# Patient Record
Sex: Male | Born: 1975
Health system: Southern US, Community
[De-identification: ages and names within clinical notes are randomized; demographics above are authoritative.]

## PROBLEM LIST (undated history)

## (undated) DIAGNOSIS — K859 Acute pancreatitis without necrosis or infection, unspecified: Secondary | ICD-10-CM

## (undated) DIAGNOSIS — I10 Essential (primary) hypertension: Secondary | ICD-10-CM

## (undated) HISTORY — PX: ROTATOR CUFF REPAIR: SHX139

## (undated) HISTORY — PX: GANGLION CYST EXCISION: SHX1691

---

## 2001-03-16 ENCOUNTER — Encounter: Payer: Self-pay | Admitting: Emergency Medicine

## 2001-03-16 ENCOUNTER — Emergency Department (HOSPITAL_COMMUNITY): Admission: EM | Admit: 2001-03-16 | Discharge: 2001-03-16 | Payer: Self-pay | Admitting: Emergency Medicine

## 2001-12-29 ENCOUNTER — Emergency Department (HOSPITAL_COMMUNITY): Admission: EM | Admit: 2001-12-29 | Discharge: 2001-12-29 | Payer: Self-pay | Admitting: Emergency Medicine

## 2003-10-27 ENCOUNTER — Emergency Department (HOSPITAL_COMMUNITY): Admission: EM | Admit: 2003-10-27 | Discharge: 2003-10-28 | Payer: Self-pay | Admitting: Emergency Medicine

## 2003-11-03 ENCOUNTER — Encounter: Admission: RE | Admit: 2003-11-03 | Discharge: 2003-11-03 | Payer: Self-pay | Admitting: Orthopedic Surgery

## 2004-04-22 ENCOUNTER — Emergency Department (HOSPITAL_COMMUNITY): Admission: EM | Admit: 2004-04-22 | Discharge: 2004-04-22 | Payer: Self-pay | Admitting: Emergency Medicine

## 2005-05-05 IMAGING — CR DG ANKLE COMPLETE 3+V*R*
3 series · 3 of 3 positions shown · non-contrast
Comparison: none

CLINICAL DATA: Ankle injury.
RIGHT ANKLE, THREE VIEWS

[view not recorded (1 of 3)]
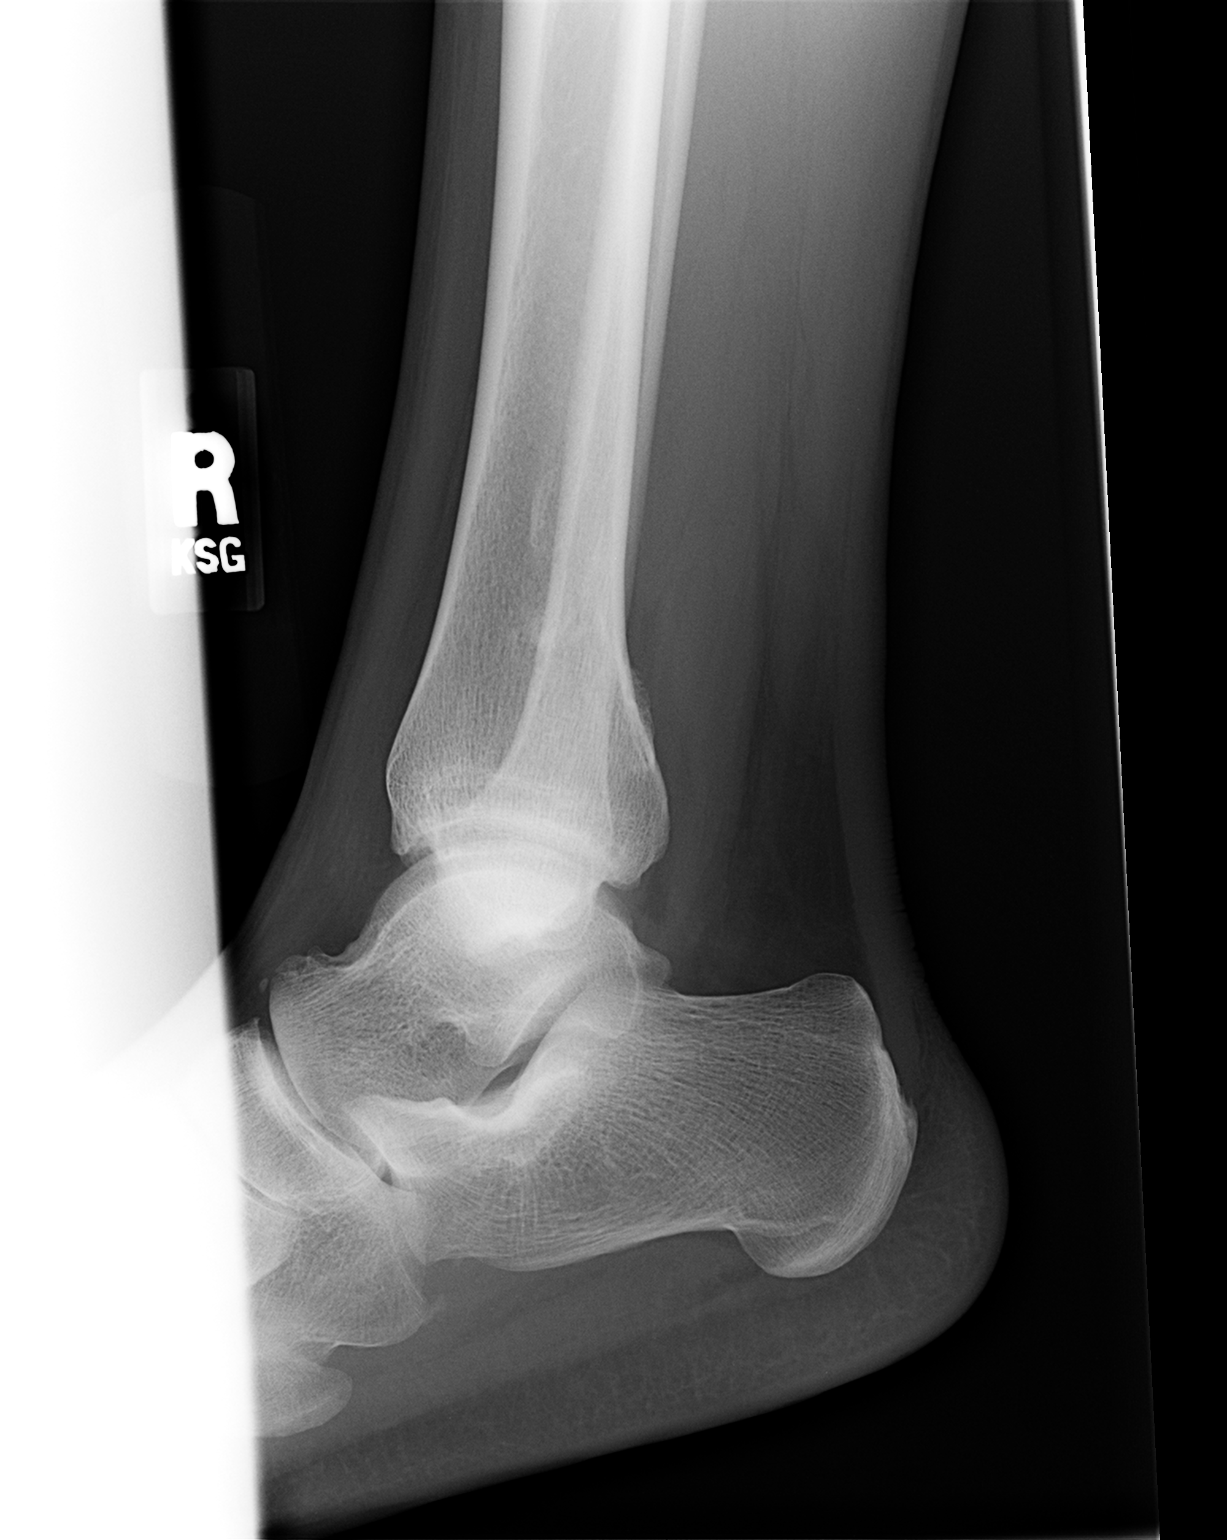

[view not recorded (2 of 3)]
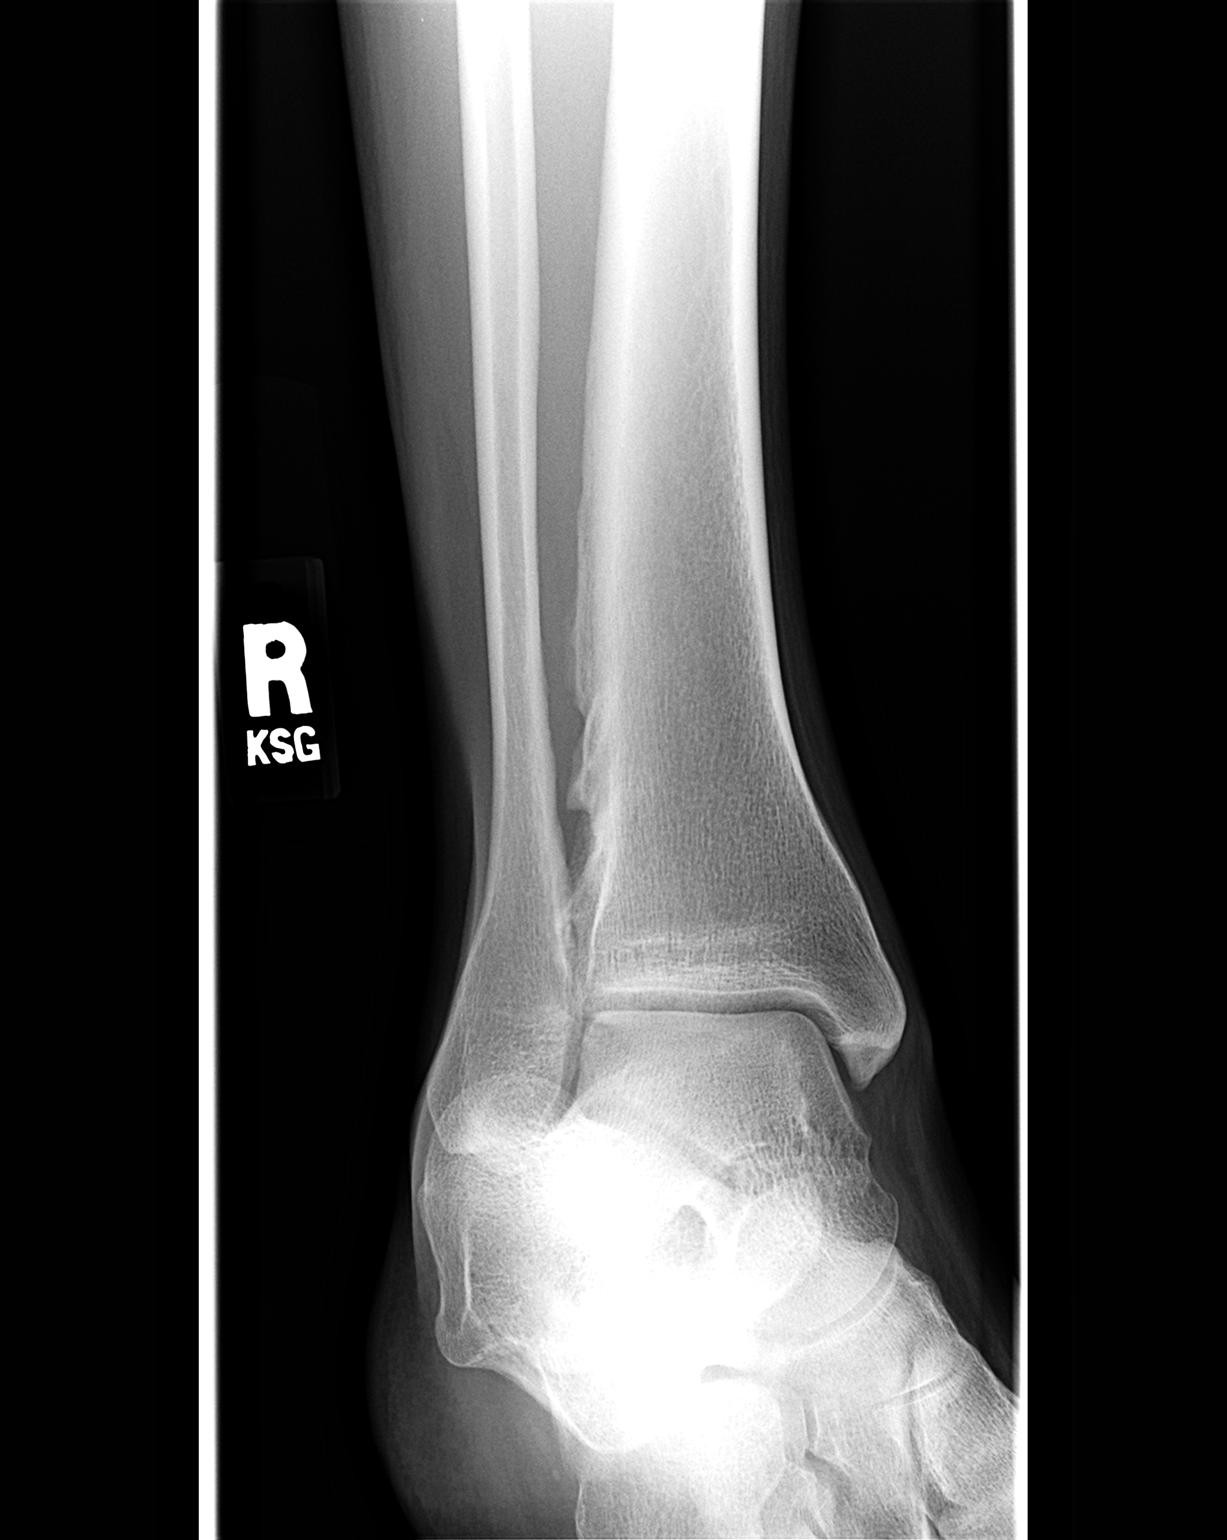

[view not recorded (3 of 3)]
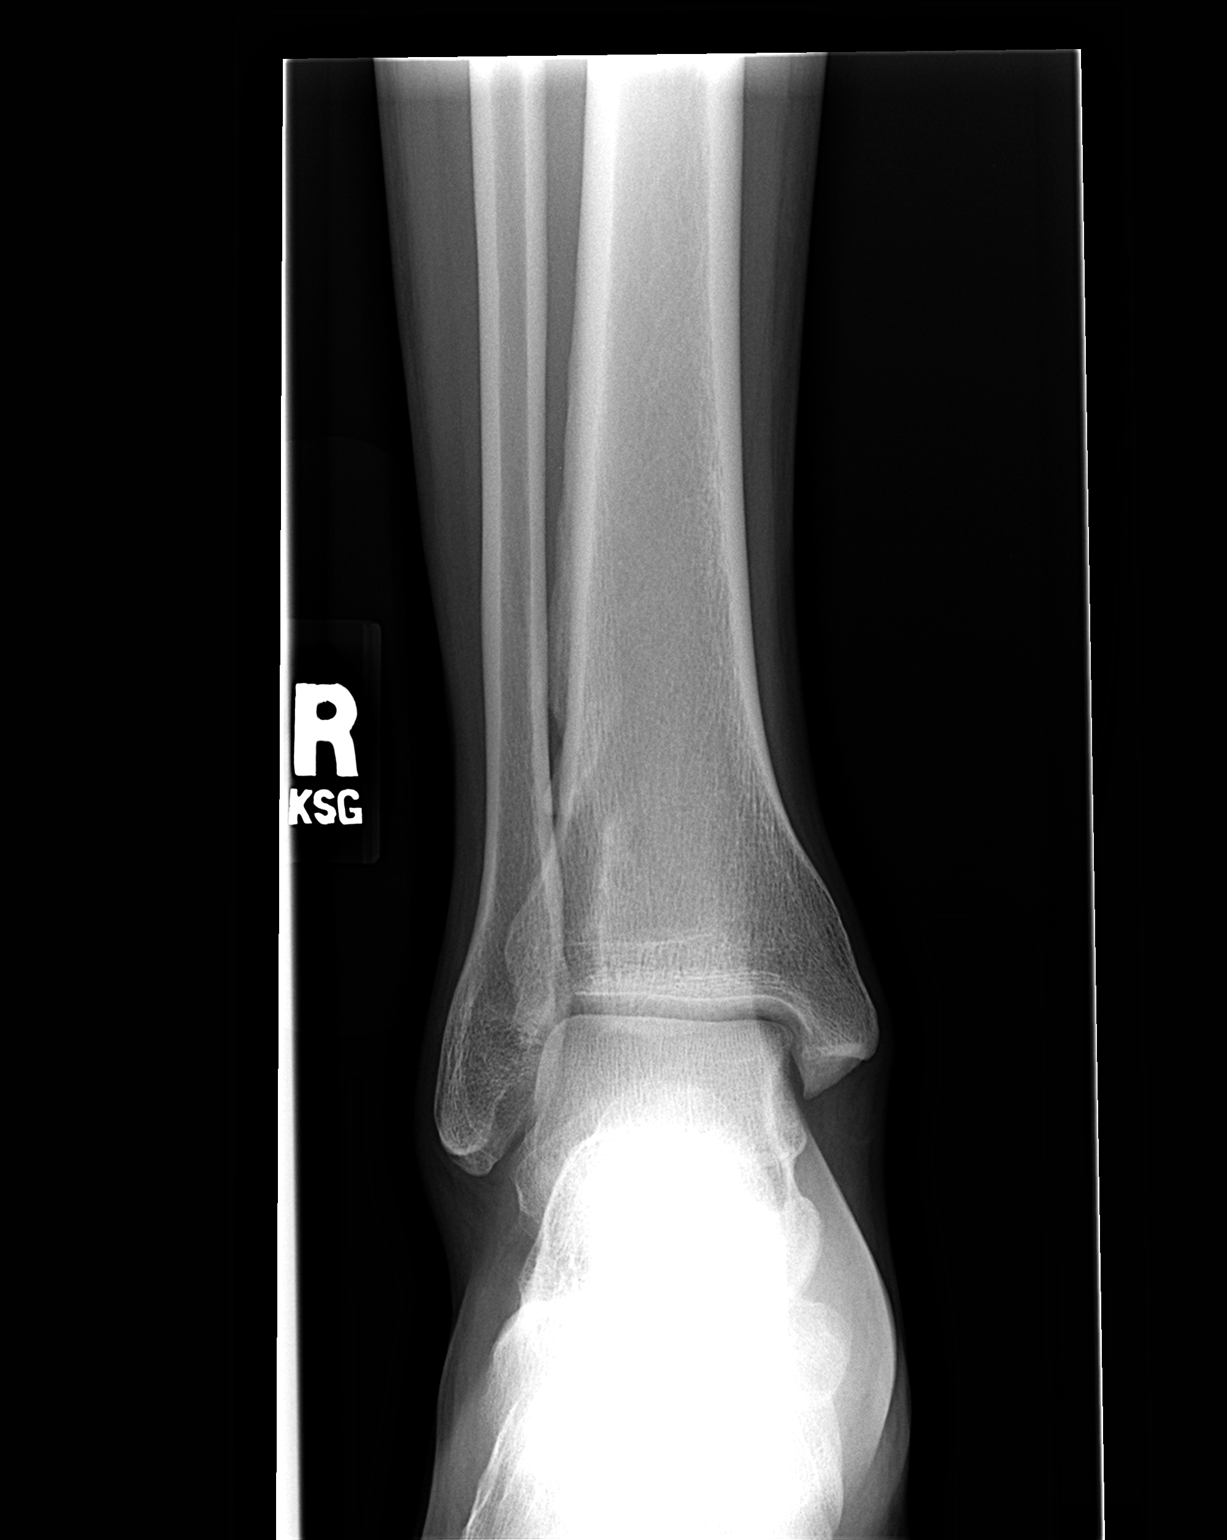

[3 of 3 positions shown; findings below may reference images not displayed]

FINDINGS: There is a small fracture fragment at the anterior and dorsal aspect of the distal talus.  This does appear to be an acute fracture.  It is approximately 2 x 8 mm.  There is a smaller round bony density anterior to the superior and dorsal talus that has corticated margins and likely represents an old avulsion fracture.  eriosteal reaction is noted at the distal tibia on the lateral aspect at the region of the metaphysis.  This does not have an ?onion skin? appearance or aggressive appearance.  However, there is an apparent lytic area in the distal tibia laterally.  It is eccentric and extends to the distal articular surface.  The margins are defined; however, they are non-sclerotic.  No other fractures or dislocations are seen. 
IMPRESSION
1.  Old avulsion from the distal talus.
2.  Acute fracture involving the distal talus.  This is a small avulsion fracture. 
3.  Lytic lesion of the distal tibia which is eccentric and extends to the epiphysis from the metaphysis.  There is a non-sclerotic margin.  Adjacent periosteal reaction is present.  In this skeletally mature young patient, considerations would include giant cell tumor, eosinophilic granuloma, infectious process.  Correlate clinically as for the need for MRI.

## 2005-07-25 ENCOUNTER — Encounter (HOSPITAL_COMMUNITY): Admission: RE | Admit: 2005-07-25 | Discharge: 2005-08-08 | Payer: Self-pay | Admitting: Orthopedic Surgery

## 2006-01-19 ENCOUNTER — Emergency Department (HOSPITAL_COMMUNITY): Admission: EM | Admit: 2006-01-19 | Discharge: 2006-01-19 | Payer: Self-pay | Admitting: Emergency Medicine

## 2007-12-21 ENCOUNTER — Ambulatory Visit (HOSPITAL_COMMUNITY): Admission: RE | Admit: 2007-12-21 | Discharge: 2007-12-21 | Payer: Self-pay | Admitting: Family Medicine

## 2008-01-04 ENCOUNTER — Encounter (HOSPITAL_COMMUNITY): Admission: RE | Admit: 2008-01-04 | Discharge: 2008-02-03 | Payer: Self-pay | Admitting: Family Medicine

## 2008-02-04 ENCOUNTER — Encounter (HOSPITAL_COMMUNITY): Admission: RE | Admit: 2008-02-04 | Discharge: 2008-03-05 | Payer: Self-pay | Admitting: Family Medicine

## 2008-04-07 ENCOUNTER — Ambulatory Visit (HOSPITAL_COMMUNITY): Admission: RE | Admit: 2008-04-07 | Discharge: 2008-04-07 | Payer: Self-pay | Admitting: Family Medicine

## 2008-05-19 ENCOUNTER — Ambulatory Visit (HOSPITAL_COMMUNITY): Payer: Self-pay | Admitting: Psychology

## 2009-01-08 ENCOUNTER — Emergency Department (HOSPITAL_COMMUNITY): Admission: EM | Admit: 2009-01-08 | Discharge: 2009-01-08 | Payer: Self-pay | Admitting: Emergency Medicine

## 2010-05-30 ENCOUNTER — Ambulatory Visit (HOSPITAL_COMMUNITY): Admission: RE | Admit: 2010-05-30 | Discharge: 2010-05-30 | Payer: Self-pay | Admitting: Family Medicine

## 2011-01-19 ENCOUNTER — Emergency Department (HOSPITAL_COMMUNITY)
Admission: EM | Admit: 2011-01-19 | Discharge: 2011-01-19 | Disposition: A | Discharge status: Home / Self Care | Payer: BC Managed Care – PPO | Attending: Emergency Medicine | Admitting: Emergency Medicine

## 2011-01-19 DIAGNOSIS — F17200 Nicotine dependence, unspecified, uncomplicated: Secondary | ICD-10-CM | POA: Insufficient documentation

## 2011-01-19 DIAGNOSIS — K089 Disorder of teeth and supporting structures, unspecified: Secondary | ICD-10-CM | POA: Insufficient documentation

## 2012-01-21 ENCOUNTER — Encounter (HOSPITAL_COMMUNITY): Payer: Self-pay | Admitting: Emergency Medicine

## 2012-01-21 ENCOUNTER — Emergency Department (HOSPITAL_COMMUNITY)
Admission: EM | Admit: 2012-01-21 | Discharge: 2012-01-21 | Disposition: A | Discharge status: Home / Self Care | Payer: Self-pay | Attending: Emergency Medicine | Admitting: Emergency Medicine

## 2012-01-21 DIAGNOSIS — Z139 Encounter for screening, unspecified: Secondary | ICD-10-CM

## 2012-01-21 DIAGNOSIS — R5383 Other fatigue: Secondary | ICD-10-CM | POA: Insufficient documentation

## 2012-01-21 DIAGNOSIS — F17200 Nicotine dependence, unspecified, uncomplicated: Secondary | ICD-10-CM | POA: Insufficient documentation

## 2012-01-21 DIAGNOSIS — R350 Frequency of micturition: Secondary | ICD-10-CM | POA: Insufficient documentation

## 2012-01-21 DIAGNOSIS — R5381 Other malaise: Secondary | ICD-10-CM | POA: Insufficient documentation

## 2012-01-21 LAB — URINALYSIS, ROUTINE W REFLEX MICROSCOPIC
Bilirubin Urine: NEGATIVE
Glucose, UA: NEGATIVE mg/dL
Hgb urine dipstick: NEGATIVE
Ketones, ur: NEGATIVE mg/dL
Nitrite: NEGATIVE

## 2012-01-21 LAB — GLUCOSE, CAPILLARY: Glucose-Capillary: 87 mg/dL (ref 70–99)

## 2012-01-21 LAB — POCT I-STAT, CHEM 8
BUN: 19 mg/dL (ref 6–23)
HCT: 45 % (ref 39.0–52.0)
Hemoglobin: 15.3 g/dL (ref 13.0–17.0)
Sodium: 141 mEq/L (ref 135–145)
TCO2: 27 mmol/L (ref 0–100)

## 2012-01-21 NOTE — Discharge Instructions (Signed)
RESOURCE GUIDE  Chronic Pain Problems: Contact [facility]  [phone] Patients need to be referred by their primary care doctor.  Insufficient Money for Medicine: Contact United Way:  call "211" or [facility] [phone].  No Primary Care Doctor: - Call [facility]  [phone] - can help you locate a primary care doctor that  accepts your insurance, provides certain services, etc. - Physician Referral Service- 1-800-533-3463  Agencies that provide inexpensive medical care: - [facility]  [phone] - [facility]  [phone] - [facility]  [phone] - [facility]  [phone] - Planned Parenthood  [phone] - [facility]  [phone]  Medicaid-accepting [place] Providers: - [facility]- [address]  [phone], Mon-Fri 9am-7pm, Sat 9am-1pm - [facility]- [address], [address]  [phone] - [facility]- [address], [address]  [phone] - [facility]- [address] [place] [address]  [phone] - Veita Bland- [address], [address], [phone]  Only accepts [place] Access Medicaid patients after they have their name  applied to their card  Self Pay (no insurance) in [place]: - Sickle Cell Patients: Dr Eric Dean, [facility]  [address], [phone] - [facility]- [address]  [phone]       -     [facility] [place]- [address]       -     [facility]- see information above (Speak to Pam H if you do not have insurance)       -  [facility]- [address], [phone]       -  [facility] [place]- [address],  [phone]       -  [facility]- [address] [place] [address], [phone]       -  Dr Osei-Bonsu-  [address], [place], [phone]       -  [facility]- [address], [phone]       -  [facility]- [address] [place] [address], [phone], also [address], [phone]       -    [facility]- [address], [phone], 1st & 3rd Saturday   every month, 10am-1pm  1) Find a Doctor and Pay Out of Pocket Although you won't have to find out who is covered by your insurance plan, it is a good idea to ask around and get recommendations. You will then need to call the office and see if the doctor you have chosen will accept you as a new patient and what types of options they offer for patients who are self-pay. Some doctors offer discounts or will set up payment plans for their patients who do not have insurance, but you will need to ask so you aren't surprised when you get to your appointment.  2) Contact Your Local Health Department Not all health departments have doctors that can see patients for sick visits, but many do, so it is worth a call to see if yours does. If you don't know where your local health department is, you can check in your phone book. The CDC also has a tool to help you locate your state's health department, and many state websites also have   listings of all of their local health departments.  3) Find a Walk-in Clinic If your illness is not likely to be very severe or complicated, you may want to try a walk in clinic. These are popping up all over the country in pharmacies, drugstores, and shopping centers. They're usually staffed by nurse practitioners or physician assistants that have been trained to treat common illnesses and complaints. They're usually fairly quick and inexpensive. However, if you have serious medical issues or chronic medical problems, these are probably not your best option  STD Testing - [place] [facility] [place], [facility], [address], [place], phone [phone] or [phone].  Monday - Friday, call for an appointment. - [place]  [facility] [place], [facility] Clinic, [address], [place], phone [phone] or [phone].  Monday - Friday, call for an appointment.  Abuse/Neglect: - [place] Child Abuse Hotline (336) 641-3795 - [place] Child Abuse Hotline 800-378-5315 (After Hours)  Emergency Shelter:  [facility] (336) 271-5985  Maternity Homes: - [facility] (336) 275-9566 - [facility] (704) 372-4663  MRSA Hotline #:   832-7006  [place] Resources  [facility] [place]  [employer] [place] [facility] [address]                 [address]         [address]  [place]                                               [place]                              [place] Phone:  [phone]                                  Phone:  [phone]                   Phone:  [phone]  [place] [facility], [phone] - [place] Services - [facility]- 1-888-581-9988       -     [facility] in [place], [address],                                  336-349-4454, Insurance  [place] Child Abuse Hotline (336) 342-1394 or (336) 342-3537 (After Hours)   Behavioral Health Services  Substance Abuse Resources: - [facility]  336-882-2125 - [facility] 336-784-9470 - [facility] 336-285-9073 - [facility] 336-845-3988 - Residential & Outpatient Substance Abuse Program  800-659-3381  Psychological Services: -  Health  [phone] - [facility]  [phone] - [place], [address], [place], ACCESS LINE: 1-800-853-5163 or 336-641-4981, Http://www.[facility].com/services/adult.htm  Dental Assistance  If unable to pay or uninsured, contact:  [facility] or [place] [facility] to become qualified for the adult dental  clinic.  Patients with Medicaid:  [facility] [address], [phone], [phone]  If unable   to pay, or uninsured, contact [facility] (712) 517-8109) or [facility] [phone] in [place], [phone] in [facility]) to become qualified for the adult dental clinic  Other Low-Cost Community Dental Services: - [facility]- [address] [place], [place], [place], [address], [phone], [phone], 2nd and 4th Thursday of the month at 6:30am.  10 clients each day by appointment, can sometimes see walk-in patients if someone does not show for an appointment. [facility]- [address] [place], [place], [address], [phone] - [facility] [address] [place], [place], [address], [phone] [facility]- [phone] [facility] Department- [phone] [facility] [phone]     Call your regular medical doctor today to schedule a follow up appointment within the next week.  Return to the Emergency Department immediately sooner if worsening.

## 2012-01-21 NOTE — ED Notes (Signed)
Pt states diabetes runs in family. Here today for check of sugar levels. Wants blood work to see if he is diabetic. Pt states weakness and urinary frequency for several months.

## 2012-01-21 NOTE — ED Provider Notes (Signed)
History   This chart was scribed for Laray Anger, DO by Sofie Rower. The patient was seen in room APA15/APA15 and the patient's care was started at 9:55 AM     CSN: [id number]  Arrival date & time 01/21/12  1308   First MD Initiated Contact with Patient 01/21/12 0945      Chief Complaint  Patient presents with  . Fatigue  . Urinary Frequency     HPI  James Gordon is a 36 y.o. male who presents to the Emergency Department complaining of gradual onset and persistence of constant generalized fatigue that began today.  Has been associated with weakness and urinary frequency for the past several months. The pt states he "came to the ED to see if I'm a diabetic."  Denies CP/SOB, no cough, no abd pain, no back pain, no N/V/D, no fevers, no rash, no sore throat, no dysuria.     Past Surgical History  Procedure Date  . Rotator cuff repair       History  Substance Use Topics  . Smoking status: Current Everyday Smoker -- 0.5 packs/day  . Smokeless tobacco: Not on file  . Alcohol Use: Yes     occ      Review of Systems ROS: Statement: All systems negative except as marked or noted in the HPI; Constitutional: Negative for fever and chills. ; ; Eyes: Negative for eye pain, redness and discharge. ; ; ENMT: Negative for ear pain, hoarseness, nasal congestion, sinus pressure and sore throat. ; ; Cardiovascular: Negative for chest pain, palpitations, diaphoresis, dyspnea and peripheral edema. ; ; Respiratory: Negative for cough, wheezing and stridor. ; ; Gastrointestinal: Negative for nausea, vomiting, diarrhea, abdominal pain, blood in stool, hematemesis, jaundice and rectal bleeding. . ; ; Genitourinary: Negative for dysuria, flank pain and hematuria. ; ; Musculoskeletal: Negative for back pain and neck pain. Negative for swelling and trauma.; ; Skin: Negative for pruritus, rash, abrasions, blisters, bruising and skin lesion.; ; Neuro: +generalized weakness, fatigue.  Negative for  headache, lightheadedness and neck stiffness. Negative for altered level of consciousness , altered mental status, extremity weakness, paresthesias, involuntary movement, seizure and syncope.     Allergies  Review of patient's allergies indicates no known allergies.  Home Medications  No current outpatient prescriptions on file.  BP 119/88  Pulse 66  Temp(Src) 98.1 F (36.7 C) (Oral)  Resp 18  Ht 6\' 1"  (1.854 m)  Wt 195 lb (88.451 kg)  BMI 25.73 kg/m2  SpO2 100%  Physical Exam 0955: Physical examination:  Nursing notes reviewed; Vital signs and O2 SAT reviewed;  Constitutional: Well developed, Well nourished, Well hydrated, In no acute distress; Head:  Normocephalic, atraumatic; Eyes: EOMI, PERRL, No scleral icterus; ENMT: Mouth and pharynx normal, Mucous membranes moist; Neck: Supple, Full range of motion, No lymphadenopathy; Cardiovascular: Regular rate and rhythm, No murmur or gallop; Respiratory: Breath sounds clear & equal bilaterally, No rales, rhonchi, wheezes, speaking full sentences with ease, Normal respiratory effort/excursion; Chest: Nontender, Movement normal; Abdomen: Soft, Nontender, Nondistended, Normal bowel sounds; Extremities: Pulses normal, No tenderness, No edema, No calf edema or asymmetry.; Neuro: AA&Ox3, Major CN grossly intact. Speech clear, no facial droop, gait steady, No gross focal motor or sensory deficits in extremities.; Skin: Color normal, Warm, Dry.   ED Course  Procedures   MDM  MDM Reviewed: nursing note and vitals Interpretation: labs   Results for orders placed during the hospital encounter of 01/21/12  GLUCOSE, CAPILLARY      Component  Value Range   Glucose-Capillary 87  70 - 99 (mg/dL)  URINALYSIS, ROUTINE W REFLEX MICROSCOPIC      Component Value Range   Color, Urine YELLOW  YELLOW    APPearance CLEAR  CLEAR    Specific Gravity, Urine 1.025  1.005 - 1.030    pH 6.5  5.0 - 8.0    Glucose, UA NEGATIVE  NEGATIVE (mg/dL)   Hgb urine  dipstick NEGATIVE  NEGATIVE    Bilirubin Urine NEGATIVE  NEGATIVE    Ketones, ur NEGATIVE  NEGATIVE (mg/dL)   Protein, ur NEGATIVE  NEGATIVE (mg/dL)   Urobilinogen, UA 0.2  0.0 - 1.0 (mg/dL)   Nitrite NEGATIVE  NEGATIVE    Leukocytes, UA NEGATIVE  NEGATIVE   POCT I-STAT, CHEM 8      Component Value Range   Sodium 141  135 - 145 (mEq/L)   Potassium 4.5  3.5 - 5.1 (mEq/L)   Chloride 108  96 - 112 (mEq/L)   BUN 19  6 - 23 (mg/dL)   Creatinine, Ser 1.61  0.50 - 1.35 (mg/dL)   Glucose, Bld 88  70 - 99 (mg/dL)   Calcium, Ion 0.96  0.45 - 1.32 (mmol/L)   TCO2 27  0 - 100 (mmol/L)   Hemoglobin 15.3  13.0 - 17.0 (g/dL)   HCT 40.9  81.1 - 91.4 (%)      11:34 AM:  Labs and UA without acute abnl.  VS remain stable.  Wants to go home now.  Dx testing d/w pt.  Questions answered.  Verb understanding, agreeable to d/c home with outpt f/u.         I personally performed the services described in this documentation, which was scribed in my presence. The recorded information has been reviewed and considered. Jamelah Sitzer Allison Quarry, DO 01/24/12 1307

## 2012-01-22 LAB — URINE CULTURE
Colony Count: NO GROWTH
Culture: NO GROWTH

## 2012-11-11 ENCOUNTER — Encounter (HOSPITAL_COMMUNITY): Payer: Self-pay | Admitting: Emergency Medicine

## 2012-11-11 ENCOUNTER — Emergency Department (HOSPITAL_COMMUNITY)
Admission: EM | Admit: 2012-11-11 | Discharge: 2012-11-11 | Disposition: A | Discharge status: Home / Self Care | Payer: Self-pay | Attending: Emergency Medicine | Admitting: Emergency Medicine

## 2012-11-11 DIAGNOSIS — J029 Acute pharyngitis, unspecified: Secondary | ICD-10-CM | POA: Insufficient documentation

## 2012-11-11 DIAGNOSIS — L739 Follicular disorder, unspecified: Secondary | ICD-10-CM

## 2012-11-11 DIAGNOSIS — L738 Other specified follicular disorders: Secondary | ICD-10-CM | POA: Insufficient documentation

## 2012-11-11 DIAGNOSIS — R110 Nausea: Secondary | ICD-10-CM | POA: Insufficient documentation

## 2012-11-11 DIAGNOSIS — J069 Acute upper respiratory infection, unspecified: Secondary | ICD-10-CM | POA: Insufficient documentation

## 2012-11-11 DIAGNOSIS — F17200 Nicotine dependence, unspecified, uncomplicated: Secondary | ICD-10-CM | POA: Insufficient documentation

## 2012-11-11 MED ORDER — PROMETHAZINE HCL 25 MG PO TABS: 25.0000 mg | ORAL_TABLET | Freq: Four times a day (QID) | ORAL | Status: DC | PRN

## 2012-11-11 MED ORDER — ONDANSETRON 8 MG PO TBDP
8.0000 mg | ORAL_TABLET | Freq: Once | ORAL | Status: AC
  Administered 2012-11-11: 8 mg via ORAL
  Filled 2012-11-11: qty 1

## 2012-11-11 NOTE — ED Notes (Signed)
Pt complaining of cold like symptoms since Monday. Also has a cyst below belly button that has begun to hurt.

## 2012-11-11 NOTE — ED Notes (Addendum)
Complains of nausea, sore throat, cough, hard to breathe, stomach pains.  States he has been taking Nyquil.  Reports a painful area on his lower mid stomach that hurts worse with movement.  No acute respiratory distress noted.

## 2012-11-11 NOTE — ED Provider Notes (Signed)
History  This chart was scribed for Lyanne Co, MD by Bennett Scrape, ED Scribe. This patient was seen in room APA12/APA12 and the patient's care was started at 7:09 AM.  CSN: [id number]  Arrival date & time 11/11/12  0706   First MD Initiated Contact with Patient 11/11/12 [phone]      Chief Complaint  Patient presents with  . Nasal Congestion  . Cyst     The history is provided by the patient. No language interpreter was used.    James Gordon is a 37 y.o. male who presents to the Emergency Department complaining of 3 days of gradual onset, non-changing, constant nausea with associated diarrhea, sore throat and nasal congestion. He reports that diarrhea is currently resolved and that the sore throat is improving. He states that he has been able to keep soup down but anything heavier increases the nausea. He reports that he has been taking nyquil with improvement in his symptoms. He denies having any sick contacts with similar symptoms. He denies fever, chills, emesis, and difficulty swallowing as associated symptoms. He also c/o a "knot" in his suprapubic region that he noticed a few days ago but denies any acute abdominal tenderness. He does not have a h/o chronic medical conditions. He is a current everyday smoker and occasional alcohol user.  History reviewed. No pertinent past medical history.  Past Surgical History  Procedure Laterality Date  . Rotator cuff repair      History reviewed. No pertinent family history.  History  Substance Use Topics  . Smoking status: Current Every Day Smoker -- 0.50 packs/day  . Smokeless tobacco: Not on file  . Alcohol Use: Yes     Comment: occ      Review of Systems  A complete 10 system review of systems was obtained and all systems are negative except as noted in the HPI and PMH.   Allergies  Review of patient's allergies indicates no known allergies.  Home Medications  No current outpatient prescriptions on file.  Triage  Vitals: BP 161/98  Pulse 72  Temp(Src) 97.9 F (36.6 C) (Oral)  Resp 18  Ht 5\' 11"  (1.803 m)  Wt 220 lb (99.791 kg)  BMI 30.7 kg/m2  SpO2 99%  Physical Exam  Nursing note and vitals reviewed. Constitutional: He is oriented to person, place, and time. He appears well-developed and well-nourished.  HENT:  Head: Normocephalic and atraumatic.  Mild tonsillar erythema, no tonsillar exudate, uvula is midline  Eyes: EOM are normal.  Neck: Normal range of motion.  Cardiovascular: Normal rate, regular rhythm, normal heart sounds and intact distal pulses.   Pulmonary/Chest: Effort normal and breath sounds normal. No respiratory distress.  Abdominal: Soft. He exhibits no distension. There is no tenderness.  Small folliculitis in the suprapubic area, no secondary signs of infection, no fluctuance, no evidence of abscess  Musculoskeletal: Normal range of motion.  Neurological: He is alert and oriented to person, place, and time.  Skin: Skin is warm and dry.  Psychiatric: He has a normal mood and affect. Judgment normal.    ED Course  Procedures (including critical care time)  DIAGNOSTIC STUDIES: Oxygen Saturation is 99% on room air, normal by my interpretation.    COORDINATION OF CARE: 7:31 AM-Informed pt that his symptoms are most likely viral. Discussed discharge plan which includes antiemetic with pt and pt agreed to plan. Also advised pt to not take Nyquil and tylenol together. For the folliculitis, advised pt to apply warm compresses to the  area.  Labs Reviewed - No data to display No results found.   1. Nausea   2. Upper respiratory tract infection   3. Pharyngitis   4. Folliculitis       MDM  Well-appearing.  Suspect viral pharyngitis.  Also with appearance of folliculitis.  No indication for incision and drainage.      I personally performed the services described in this documentation, which was scribed in my presence. The recorded information has been reviewed and  is accurate.      Lyanne Co, MD 11/11/12 [phone]

## 2012-11-15 ENCOUNTER — Emergency Department (HOSPITAL_COMMUNITY)
Admission: EM | Admit: 2012-11-15 | Discharge: 2012-11-15 | Disposition: A | Discharge status: Home / Self Care | Payer: Self-pay | Attending: Emergency Medicine | Admitting: Emergency Medicine

## 2012-11-15 DIAGNOSIS — L02211 Cutaneous abscess of abdominal wall: Secondary | ICD-10-CM

## 2012-11-15 DIAGNOSIS — L02219 Cutaneous abscess of trunk, unspecified: Secondary | ICD-10-CM | POA: Insufficient documentation

## 2012-11-15 DIAGNOSIS — F17200 Nicotine dependence, unspecified, uncomplicated: Secondary | ICD-10-CM | POA: Insufficient documentation

## 2012-11-15 LAB — GLUCOSE, CAPILLARY: Glucose-Capillary: 94 mg/dL (ref 70–99)

## 2012-11-15 MED ORDER — SULFAMETHOXAZOLE-TMP DS 800-160 MG PO TABS
1.0000 | ORAL_TABLET | Freq: Once | ORAL | Status: AC
  Administered 2012-11-15: 1 via ORAL
  Filled 2012-11-15: qty 1

## 2012-11-15 MED ORDER — SULFAMETHOXAZOLE-TRIMETHOPRIM 800-160 MG PO TABS: 1.0000 | ORAL_TABLET | Freq: Two times a day (BID) | ORAL | Status: DC

## 2012-11-15 NOTE — ED Provider Notes (Signed)
History  This chart was scribed for Donnetta Hutching, MD, by Candelaria Stagers, ED Scribe. This patient was seen in room APA17/APA17 and the patient's care was started at 11:25 AM   CSN: [id number]  Arrival date & time 11/15/12  1049   First MD Initiated Contact with Patient 11/15/12 1112      Chief Complaint  Patient presents with  . Abscess     The history is provided by the patient. No language interpreter was used.   James Gordon is a 37 y.o. male who presents to the Emergency Department complaining of a painful abscess to his mid lower abdomen that started about one week ago and has gradually worsened.  Pt was seen in the ED last week and was advised to apply a warm compress which he reports did not improve sx.  Pt reports he saw his PCP three days ago and was prescribed an antibiotic.  He reports the area has increased in size since starting the antibiotic.  Nothing seems to make the sx better or worse.  He denies h/o diabetes.     Augmentin   No past medical history on file.  Past Surgical History  Procedure Laterality Date  . Rotator cuff repair      No family history on file.  History  Substance Use Topics  . Smoking status: Current Every Day Smoker -- 0.50 packs/day  . Smokeless tobacco: Not on file  . Alcohol Use: Yes     Comment: occ      Review of Systems  Constitutional: Negative for fever and chills.  Skin: Positive for wound (abscess to suprapubic area).  All other systems reviewed and are negative.    Allergies  Review of patient's allergies indicates no known allergies.  Home Medications   Current Outpatient Rx  Name  Route  Sig  Dispense  Refill  . promethazine (PHENERGAN) 25 MG tablet   Oral   Take 1 tablet (25 mg total) by mouth every 6 (six) hours as needed for nausea.   12 tablet   0     BP 143/107  Pulse 103  Temp(Src) 98.7 F (37.1 C) (Oral)  Resp 18  Ht 6' (1.829 m)  Wt 215 lb (97.523 kg)  BMI 29.15 kg/m2  SpO2 100%  Physical  Exam  Nursing note and vitals reviewed. Constitutional: He is oriented to person, place, and time. He appears well-developed and well-nourished. No distress.  HENT:  Head: Normocephalic and atraumatic.  Eyes: EOM are normal.  Neck: Neck supple. No tracheal deviation present.  Cardiovascular: Normal rate.   Pulmonary/Chest: Effort normal. No respiratory distress.  Musculoskeletal: Normal range of motion.  Neurological: He is alert and oriented to person, place, and time.  Skin: Skin is warm and dry.  Area of induration about 3 cm in diameter with central early pustule to the superior suprapubic.   Psychiatric: He has a normal mood and affect. His behavior is normal.    ED Course  Procedures   DIAGNOSTIC STUDIES: Oxygen Saturation is 100% on room air, normal by my interpretation.    COORDINATION OF CARE:  11:29 AM Discussed course of care which includes antibiotics.  Advised pt to keep the area clean and soak in warm baths.  Pt understands and agrees.    Labs Reviewed - No data to display No results found.   No diagnosis found.    MDM  Abscess not yet ready for incision and drainage. Will change antibiotic from Augmentin to Septra. Hot  soaks. Discussed in detail with patient  I personally performed the services described in this documentation, which was scribed in my presence. The recorded information has been reviewed and is accurate.        Donnetta Hutching, MD 11/15/12 1215

## 2012-11-15 NOTE — ED Notes (Signed)
Unable to print work note. 3 day pink work note given.

## 2012-11-15 NOTE — ED Notes (Addendum)
Was here last week for "knot" to mid/lower abd. States is bigger and pain worse now. Hurst worse with movement. Denies swelling to genital area. Nad. Golf ball size abscess noted. slgiht redness noted also

## 2012-11-17 ENCOUNTER — Emergency Department (HOSPITAL_COMMUNITY)
Admission: EM | Admit: 2012-11-17 | Discharge: 2012-11-17 | Disposition: A | Discharge status: Home / Self Care | Payer: Self-pay | Attending: Emergency Medicine | Admitting: Emergency Medicine

## 2012-11-17 ENCOUNTER — Encounter (HOSPITAL_COMMUNITY): Payer: Self-pay

## 2012-11-17 DIAGNOSIS — F17200 Nicotine dependence, unspecified, uncomplicated: Secondary | ICD-10-CM | POA: Insufficient documentation

## 2012-11-17 DIAGNOSIS — L02219 Cutaneous abscess of trunk, unspecified: Secondary | ICD-10-CM | POA: Insufficient documentation

## 2012-11-17 DIAGNOSIS — L0291 Cutaneous abscess, unspecified: Secondary | ICD-10-CM

## 2012-11-17 DIAGNOSIS — Z79899 Other long term (current) drug therapy: Secondary | ICD-10-CM | POA: Insufficient documentation

## 2012-11-17 MED ORDER — OXYCODONE-ACETAMINOPHEN 5-325 MG PO TABS: 1.0000 | ORAL_TABLET | ORAL | Status: DC | PRN

## 2012-11-17 NOTE — ED Provider Notes (Signed)
History     CSN: [id number]  Arrival date & time 11/17/12  0133   First MD Initiated Contact with Patient 11/17/12 0201      Chief Complaint  Patient presents with  . Wound Check     Patient is a 37 y.o. male presenting with wound check. The history is provided by the patient.  Wound Check This is a recurrent problem. The current episode started more than 2 days ago. The problem occurs constantly. The problem has been gradually worsening. Pertinent negatives include no abdominal pain. Exacerbated by: palpation. The symptoms are relieved by rest.  pt reports he has had abscess to lower abdomen for past several days He reports he is taking antibiotic (augmentin) He reports it is now draining but it is still painful No other acute issues at this time  PMH - none  Past Surgical History  Procedure Laterality Date  . Rotator cuff repair      No family history on file.  History  Substance Use Topics  . Smoking status: Current Every Day Smoker -- 0.50 packs/day  . Smokeless tobacco: Not on file  . Alcohol Use: Yes     Comment: occ      Review of Systems  Constitutional: Negative for fever.  Gastrointestinal: Negative for abdominal pain.  Skin: Positive for wound.    Allergies  Review of patient's allergies indicates no known allergies.  Home Medications   Current Outpatient Rx  Name  Route  Sig  Dispense  Refill  . amoxicillin-clavulanate (AUGMENTIN) 875-125 MG per tablet   Oral   Take 1 tablet by mouth 2 (two) times daily. Starting 11/12/2012 x 10 days.         . promethazine (PHENERGAN) 25 MG tablet   Oral   Take 1 tablet (25 mg total) by mouth every 6 (six) hours as needed for nausea.   12 tablet   0   . lisinopril (PRINIVIL,ZESTRIL) 10 MG tablet   Oral   Take 10 mg by mouth daily.         Marland Kitchen oxyCODONE-acetaminophen (PERCOCET/ROXICET) 5-325 MG per tablet   Oral   Take 1 tablet by mouth every 4 (four) hours as needed for pain.   10 tablet   0   .  sulfamethoxazole-trimethoprim (BACTRIM DS,SEPTRA DS) 800-160 MG per tablet   Oral   Take 1 tablet by mouth 2 (two) times daily. One po bid x 3 days   20 tablet   0     BP 156/83  Pulse 82  Temp(Src) 97.7 F (36.5 C) (Oral)  Resp 20  Ht 6' (1.829 m)  Wt 215 lb (97.523 kg)  BMI 29.15 kg/m2  SpO2 98%  Physical Exam CONSTITUTIONAL: Well developed/well nourished HEAD: Normocephalic/atraumatic EYES: EOMI ENMT: Mucous membranes moist NECK: supple no meningeal signs CV: S1/S2 noted, no murmurs/rubs/gallops noted LUNGS: Lungs are clear to auscultation bilaterally, no apparent distress ABDOMEN: soft, nontender, no rebound or guarding NEURO: Pt is awake/alert, moves all extremitiesx4 EXTREMITIES:  full ROM SKIN: warm, color normal. Small abscess to suprapubic region that is actively draining pus.  No crepitance.  Minimal surrounding erythema.  Minimal induration noted.  No fluctuance.   PSYCH: no abnormalities of mood noted  ED Course  Procedures  1. Abscess     Pt with small abscess that is actively draining.  I do not feel that it is amenable to incision and drainage.  I advised to continue warm compresses.  He didn't start bactrim, only taking  augmentin.  I did add on percocet for pain control.  Recent labs reveals no signs of diabetes  MDM  Nursing notes including past medical history and social history reviewed and considered in documentation Previous records reviewed and considered - recent ED visit reviewed         Joya Gaskins, MD 11/17/12 [phone]

## 2012-11-17 NOTE — ED Notes (Signed)
Pt seen here a couple of times for abcess to groin area, states it has opened up and is draining now

## 2014-01-18 ENCOUNTER — Emergency Department (HOSPITAL_COMMUNITY)
Admission: EM | Admit: 2014-01-18 | Discharge: 2014-01-18 | Disposition: A | Discharge status: Home / Self Care | Payer: BC Managed Care – PPO | Attending: Emergency Medicine | Admitting: Emergency Medicine

## 2014-01-18 ENCOUNTER — Encounter (HOSPITAL_COMMUNITY): Payer: Self-pay | Admitting: Emergency Medicine

## 2014-01-18 DIAGNOSIS — R109 Unspecified abdominal pain: Secondary | ICD-10-CM

## 2014-01-18 DIAGNOSIS — Z8719 Personal history of other diseases of the digestive system: Secondary | ICD-10-CM

## 2014-01-18 DIAGNOSIS — Z79899 Other long term (current) drug therapy: Secondary | ICD-10-CM | POA: Insufficient documentation

## 2014-01-18 DIAGNOSIS — R1013 Epigastric pain: Secondary | ICD-10-CM | POA: Insufficient documentation

## 2014-01-18 DIAGNOSIS — Z87891 Personal history of nicotine dependence: Secondary | ICD-10-CM | POA: Insufficient documentation

## 2014-01-18 HISTORY — DX: Acute pancreatitis without necrosis or infection, unspecified: K85.90

## 2014-01-18 LAB — COMPREHENSIVE METABOLIC PANEL
ALBUMIN: 4.3 g/dL (ref 3.5–5.2)
ALK PHOS: 87 U/L (ref 39–117)
ALT: 16 U/L (ref 0–53)
AST: 23 U/L (ref 0–37)
BILIRUBIN TOTAL: 0.6 mg/dL (ref 0.3–1.2)
BUN: 20 mg/dL (ref 6–23)
CO2: 23 meq/L (ref 19–32)
CREATININE: 1.08 mg/dL (ref 0.50–1.35)
Calcium: 9.4 mg/dL (ref 8.4–10.5)
Chloride: 101 mEq/L (ref 96–112)
GFR calc Af Amer: >90 mL/min (ref >90)
GFR calc non Af Amer: 86 mL/min — ABNORMAL LOW (ref >90)
GLUCOSE: 91 mg/dL (ref 70–99)
Potassium: 3.5 mEq/L — ABNORMAL LOW (ref 3.7–5.3)
SODIUM: 140 meq/L (ref 137–147)
TOTAL PROTEIN: 7.9 g/dL (ref 6.0–8.3)

## 2014-01-18 LAB — LIPASE, BLOOD: LIPASE: 20 U/L (ref 11–59)

## 2014-01-18 LAB — CBC WITH DIFFERENTIAL/PLATELET
Basophils Absolute: 0 10*3/uL (ref 0.0–0.1)
Basophils Relative: 0 % (ref 0–1)
EOS PCT: 0 % (ref 0–5)
Eosinophils Absolute: 0 10*3/uL (ref 0.0–0.7)
HCT: 43 % (ref 39.0–52.0)
HEMOGLOBIN: 14.6 g/dL (ref 13.0–17.0)
LYMPHS ABS: 2.1 10*3/uL (ref 0.7–4.0)
LYMPHS PCT: 29 % (ref 12–46)
MCH: 28.6 pg (ref 26.0–34.0)
MCHC: 34 g/dL (ref 30.0–36.0)
MCV: 84.3 fL (ref 78.0–100.0)
Monocytes Absolute: 0.9 10*3/uL (ref 0.1–1.0)
Monocytes Relative: 13 % — ABNORMAL HIGH (ref 3–12)
NEUTROS ABS: 4.3 10*3/uL (ref 1.7–7.7)
NEUTROS PCT: 58 % (ref 43–77)
PLATELETS: 249 10*3/uL (ref 150–400)
RBC: 5.1 MIL/uL (ref 4.22–5.81)
RDW: 14.1 % (ref 11.5–15.5)
WBC: 7.4 10*3/uL (ref 4.0–10.5)

## 2014-01-18 MED ORDER — ALPRAZOLAM 0.5 MG PO TABS: 0.5000 mg | ORAL_TABLET | Freq: Every evening | ORAL | Status: AC | PRN

## 2014-01-18 MED ORDER — MORPHINE SULFATE 4 MG/ML IJ SOLN
4.0000 mg | Freq: Once | INTRAMUSCULAR | Status: AC
  Administered 2014-01-18: 4 mg via INTRAVENOUS
  Filled 2014-01-18: qty 1

## 2014-01-18 MED ORDER — ONDANSETRON HCL 4 MG/2ML IJ SOLN
4.0000 mg | Freq: Once | INTRAMUSCULAR | Status: AC
  Administered 2014-01-18: 4 mg via INTRAVENOUS
  Filled 2014-01-18: qty 2

## 2014-01-18 MED ORDER — SODIUM CHLORIDE 0.9 % IV BOLUS (SEPSIS)
1000.0000 mL | Freq: Once | INTRAVENOUS | Status: AC
  Administered 2014-01-18: 1000 mL via INTRAVENOUS

## 2014-01-18 NOTE — Discharge Instructions (Signed)
Prilosec 20 mg twice daily.  Xanax as prescribed as needed for anxiety.  Followup with your primary Dr. in the next week and return to the emergency department if you develop severe pain, high fever, bloody stool, or any other new and concerning symptoms.   Abdominal Pain, Adult Many things can cause abdominal pain. Usually, abdominal pain is not caused by a disease and will improve without treatment. It can often be observed and treated at home. Your health care provider will do a physical exam and possibly order blood tests and X-rays to help determine the seriousness of your pain. However, in many cases, more time must pass before a clear cause of the pain can be found. Before that point, your health care provider may not know if you need more testing or further treatment. HOME CARE INSTRUCTIONS  Monitor your abdominal pain for any changes. The following actions may help to alleviate any discomfort you are experiencing:  Only take over-the-counter or prescription medicines as directed by your health care provider.  Do not take laxatives unless directed to do so by your health care provider.  Try a clear liquid diet (broth, tea, or water) as directed by your health care provider. Slowly move to a bland diet as tolerated. SEEK MEDICAL CARE IF:  You have unexplained abdominal pain.  You have abdominal pain associated with nausea or diarrhea.  You have pain when you urinate or have a bowel movement.  You experience abdominal pain that wakes you in the night.  You have abdominal pain that is worsened or improved by eating food.  You have abdominal pain that is worsened with eating fatty foods. SEEK IMMEDIATE MEDICAL CARE IF:   Your pain does not go away within 2 hours.  You have a fever.  You keep throwing up (vomiting).  Your pain is felt only in portions of the abdomen, such as the right side or the left lower portion of the abdomen.  You pass bloody or black tarry  stools. MAKE SURE YOU:  Understand these instructions.   Will watch your condition.   Will get help right away if you are not doing well or get worse.  Document Released: 05/15/2005 Document Revised: 05/26/2013 Document Reviewed: 04/14/2013 [facility] Patient Information 2014 [place], [place].

## 2014-01-18 NOTE — ED Notes (Signed)
abd discomfort, n/v, Hx of pancreatitis.

## 2014-01-18 NOTE — ED Provider Notes (Signed)
CSN: [id number]     Arrival date & time 01/18/14  1714 History  This chart was scribed for Geoffery Lyonsouglas Sharlee Rufino, MD by [place] Peace, ED Scribe. The patient was seen in [facility]. The patient's care was started at 5:36 PM.  Chief Complaint  Patient presents with  . Emesis   The history is provided by the patient. No language interpreter was used.   HPI Comments: James FlurryJerry Gordon is a 38 y.o. male with a [facility], who presents to the Emergency Department complaining of intermittent vomiting and constant epigastric abdominal pain. He states that this started about a week ago but has worsened over the past 3 days ago. Pt  states that drinking fluids and consuming solids exacerbates his sickness. He states that he hasn't had anything to eat or drink today along with complaining of dysuria. Pt further states that he has lost about 14 pounds in the last 5 days. He states lately he has had episodes where he feels like passing out. He denies having true syncopal episode. He denies being around any known sick contacts. He states that he has had no abdominal surgeries. He denies any other associated symptoms.   Past Medical History  Diagnosis Date  . Pancreatitis    Past Surgical History  Procedure Laterality Date  . Rotator cuff repair     History reviewed. No pertinent family history. History  Substance Use Topics  . Smoking status: Former Smoker -- 0.50 packs/day  . Smokeless tobacco: Not on file  . Alcohol Use: No     Comment: No etoh since March    Review of Systems A complete 10 system review of systems was obtained and all systems are negative except as noted in the HPI and PMH.   Allergies  Review of patient's allergies indicates no known allergies.  Home Medications   Prior to Admission medications   Medication Sig Start Date End Date Taking? Authorizing Provider  lisinopril (PRINIVIL,ZESTRIL) 10 MG tablet Take 10 mg by mouth daily.   Yes Historical Provider,  MD   BP 134/109  Pulse 100  Temp(Src) 98.6 F (37 C) (Oral)  Resp 18  Wt 178 lb 3 oz (80.825 kg)  SpO2 100% Physical Exam  Nursing note and vitals reviewed. Constitutional: He is oriented to person, place, and time. He appears well-developed and well-nourished. No distress.  HENT:  Head: Normocephalic and atraumatic.  Eyes: Conjunctivae and EOM are normal.  Neck: Normal range of motion. Neck supple. No tracheal deviation present.  Cardiovascular: Normal rate.   Pulmonary/Chest: Effort normal and breath sounds normal. No respiratory distress.  Abdominal: Soft. Bowel sounds are normal. He exhibits no distension. There is tenderness. There is no rebound and no guarding.  Tender to palpation in the epigastric region.   Musculoskeletal: Normal range of motion.  Neurological: He is alert and oriented to person, place, and time.  Skin: Skin is warm and dry.  Psychiatric: He has a normal mood and affect. His behavior is normal.   ED Course  Procedures (including critical care time) DIAGNOSTIC STUDIES: Oxygen Saturation is 100% on room air, normal by my interpretation.    COORDINATION OF CARE: 5:45 PM- Treatment plan was discussed which includes pain and nausea medication with patient who verbalizes understanding and agrees.   Results for orders placed during the hospital encounter of 01/18/14  CBC WITH DIFFERENTIAL      Result Value Ref Range   WBC 7.4  4.0 - 10.5 K/uL  RBC 5.10  4.22 - 5.81 MIL/uL   Hemoglobin 14.6  13.0 - 17.0 g/dL   HCT 01.5  86.8 - 25.7 %   MCV 84.3  78.0 - 100.0 fL   MCH 28.6  26.0 - 34.0 pg   MCHC 34.0  30.0 - 36.0 g/dL   RDW 49.3  55.2 - 17.4 %   Platelets 249  150 - 400 K/uL   Neutrophils Relative % 58  43 - 77 %   Neutro Abs 4.3  1.7 - 7.7 K/uL   Lymphocytes Relative 29  12 - 46 %   Lymphs Abs 2.1  0.7 - 4.0 K/uL   Monocytes Relative 13 (*) 3 - 12 %   Monocytes Absolute 0.9  0.1 - 1.0 K/uL   Eosinophils Relative 0  0 - 5 %   Eosinophils Absolute  0.0  0.0 - 0.7 K/uL   Basophils Relative 0  0 - 1 %   Basophils Absolute 0.0  0.0 - 0.1 K/uL  COMPREHENSIVE METABOLIC PANEL      Result Value Ref Range   Sodium 140  137 - 147 mEq/L   Potassium 3.5 (*) 3.7 - 5.3 mEq/L   Chloride 101  96 - 112 mEq/L   CO2 23  19 - 32 mEq/L   Glucose, Bld 91  70 - 99 mg/dL   BUN 20  6 - 23 mg/dL   Creatinine, Ser 7.15  0.50 - 1.35 mg/dL   Calcium 9.4  8.4 - 95.3 mg/dL   Total Protein 7.9  6.0 - 8.3 g/dL   Albumin 4.3  3.5 - 5.2 g/dL   AST 23  0 - 37 U/L   ALT 16  0 - 53 U/L   Alkaline Phosphatase 87  39 - 117 U/L   Total Bilirubin 0.6  0.3 - 1.2 mg/dL   GFR calc non Af Amer 86 (*) >90 mL/min   GFR calc Af Amer >90  >90 mL/min  LIPASE, BLOOD      Result Value Ref Range   Lipase 20  11 - 59 U/L   No results found.   EKG Interpretation None     Medications  sodium chloride 0.9 % bolus 1,000 mL (0 mLs Intravenous Stopped 01/18/14 1937)  morphine 4 MG/ML injection 4 mg (4 mg Intravenous Given 01/18/14 1831)  ondansetron (ZOFRAN) injection 4 mg (4 mg Intravenous Given 01/18/14 1831)   MDM   Final diagnoses:  None    Patient is a 38 year old male who presents with epigastric discomfort. He was told he had pancreatitis at a recent hospitalization in [place]. Workup today reveals no elevation of white count, normal liver functions, and normal lipase. He is tender to palpation in the epigastrium and I am uncertain as to whether this is early pancreatitis or possibly gastritis. He is feeling better with IV fluids and medications given in the ED. I do not feel as though further imaging is indicated and I doubt an acute process. He will be discharged with instructions to take Prilosec. His mother who is present at bedside is concerned that he is having issues with anxiety. She believes that may be the cause of his symptoms presently. She is requested that I provide a prescription for anxiety medication. I will prescribe a small quantity of Xanax. He is to followup  with his primary Dr. if not improving and return if he worsens.  I personally performed the services described in this documentation, which was scribed in my presence. The  recorded information has been reviewed and is accurate.       Geoffery Lyons, MD 01/18/14 [phone]

## 2014-02-26 ENCOUNTER — Emergency Department (HOSPITAL_COMMUNITY)
Admission: EM | Admit: 2014-02-26 | Discharge: 2014-02-26 | Disposition: A | Discharge status: Home / Self Care | Payer: BC Managed Care – PPO | Attending: Emergency Medicine | Admitting: Emergency Medicine

## 2014-02-26 ENCOUNTER — Encounter (HOSPITAL_COMMUNITY): Payer: Self-pay | Admitting: Emergency Medicine

## 2014-02-26 DIAGNOSIS — Z202 Contact with and (suspected) exposure to infections with a predominantly sexual mode of transmission: Secondary | ICD-10-CM

## 2014-02-26 DIAGNOSIS — Z87891 Personal history of nicotine dependence: Secondary | ICD-10-CM | POA: Insufficient documentation

## 2014-02-26 DIAGNOSIS — Z79899 Other long term (current) drug therapy: Secondary | ICD-10-CM | POA: Insufficient documentation

## 2014-02-26 DIAGNOSIS — Z2089 Contact with and (suspected) exposure to other communicable diseases: Secondary | ICD-10-CM | POA: Insufficient documentation

## 2014-02-26 DIAGNOSIS — I10 Essential (primary) hypertension: Secondary | ICD-10-CM | POA: Insufficient documentation

## 2014-02-26 DIAGNOSIS — Z8719 Personal history of other diseases of the digestive system: Secondary | ICD-10-CM | POA: Insufficient documentation

## 2014-02-26 HISTORY — DX: Essential (primary) hypertension: I10

## 2014-02-26 LAB — HIV ANTIBODY (ROUTINE TESTING W REFLEX): HIV: NONREACTIVE

## 2014-02-26 LAB — RPR

## 2014-02-26 MED ORDER — METRONIDAZOLE 500 MG PO TABS: 500.0000 mg | ORAL_TABLET | Freq: Two times a day (BID) | ORAL | Status: AC

## 2014-02-26 NOTE — Discharge Instructions (Signed)
Sexually Transmitted Disease A sexually transmitted disease (STD) is a disease or infection often passed to another person during sex. However, STDs can be passed through nonsexual ways. An STD can be passed through:  Spit (saliva).  Semen.  Blood.  Mucus from the vagina.  Pee (urine). HOW CAN I LESSEN MY CHANCES OF GETTING AN STD?  Use:  Latex condoms.  Water-soluble lubricants with condoms. Do not use petroleum jelly or oils.  Dental dams. These are small pieces of latex that are used as a barrier during oral sex.  Avoid having more than one sex partner.  Do not have sex with someone who has other sex partners.  Do not have sex with anyone you do not know or who is at high risk for an STD.  Avoid risky sex that can break your skin.  Do not have sex if you have open sores on your mouth or skin.  Avoid drinking too much alcohol or taking illegal drugs. Alcohol and drugs can affect your good judgment.  Avoid oral and anal sex acts.  Get shots (vaccines) for HPV and hepatitis.  If you are at risk of being infected with HIV, it is advised that you take a certain medicine daily to prevent HIV infection. This is called pre-exposure prophylaxis (PrEP). You may be at risk if:  You are a man who has sex with other men (MSM).  You are attracted to the opposite sex (heterosexual) and are having sex with more than one partner.  You take drugs with a needle.  You have sex with someone who has HIV.  Talk with your doctor about if you are at high risk of being infected with HIV. If you begin to take PrEP, get tested for HIV first. Get tested every 3 months for as long as you are taking PrEP. WHAT SHOULD I DO IF I THINK I HAVE AN STD?  See your doctor.  Tell your sex partner(s) that you have an STD. They should be tested and treated.  Do not have sex until your doctor says it is okay. WHEN SHOULD I GET HELP? Get help right away if:  You have bad belly (abdominal)  pain.  You are a man and have puffiness (swelling) or pain in your testicles.  You are a woman and have puffiness in your vagina. Document Released: 09/12/2004 Document Revised: 08/10/2013 Document Reviewed: 01/29/2013 [employer] Patient Information 2015 [employer]. This information is not intended to replace advice given to you by your health care provider. Make sure you discuss any questions you have with your health care provider.  
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
## 2014-02-26 NOTE — ED Provider Notes (Signed)
CSN: [id number]     Arrival date & time 02/26/14  1147 History   First MD Initiated Contact with Patient 02/26/14 1218     Chief Complaint  Patient presents with  . Exposure to STD     (Consider location/radiation/quality/duration/timing/severity/associated sxs/prior Treatment) HPI   James Gordon is a 38 y.o. male presenting for evaluation of exposure to trichomonas.  His girlfriend who is at the bedside was seen here yesterday and diagnosed with trichomonas.  He denies symptoms at this time including abdominal pain, penile discharge, dysuria, nausea, vomiting or rash.  He would like to be screened for "everything".   Past medical history is significant for htn and a prior episode of acute pancreatitis.   Past Medical History  Diagnosis Date  . Pancreatitis   . Hypertension    Past Surgical History  Procedure Laterality Date  . Rotator cuff repair    . Ganglion cyst excision     No family history on file. History  Substance Use Topics  . Smoking status: Former Smoker -- 0.50 packs/day  . Smokeless tobacco: Not on file  . Alcohol Use: No     Comment: No etoh since March    Review of Systems  Constitutional: Negative for fever.  HENT: Negative for congestion and sore throat.   Eyes: Negative.   Respiratory: Negative for chest tightness and shortness of breath.   Cardiovascular: Negative for chest pain.  Gastrointestinal: Negative for nausea and abdominal pain.  Genitourinary: Negative.  Negative for dysuria, discharge, genital sores and penile pain.  Musculoskeletal: Negative for arthralgias, joint swelling and neck pain.  Skin: Negative.  Negative for rash and wound.  Neurological: Negative for dizziness, weakness, light-headedness, numbness and headaches.  Psychiatric/Behavioral: Negative.       Allergies  Review of patient's allergies indicates no known allergies.  Home Medications   Prior to Admission medications   Medication Sig Start Date End Date Taking?  Authorizing Provider  ALPRAZolam Prudy Feeler(XANAX) 0.5 MG tablet Take 1 tablet (0.5 mg total) by mouth at bedtime as needed for anxiety. 01/18/14   Geoffery Lyonsouglas Delo, MD  lisinopril (PRINIVIL,ZESTRIL) 10 MG tablet Take 10 mg by mouth daily.    Historical Provider, MD  metroNIDAZOLE (FLAGYL) 500 MG tablet Take 1 tablet (500 mg total) by mouth 2 (two) times daily. 02/26/14   Burgess AmorJulie Khalie Wince, PA-C   BP 156/99  Pulse 84  Temp(Src) 98.1 F (36.7 C) (Oral)  Resp 16  Ht 6' (1.829 m)  Wt 183 lb (83.008 kg)  BMI 24.81 kg/m2  SpO2 97% Physical Exam  Nursing note and vitals reviewed. Constitutional: He appears well-developed and well-nourished.  HENT:  Head: Normocephalic and atraumatic.  Eyes: Conjunctivae are normal.  Cardiovascular: Normal rate and regular rhythm.   Pulmonary/Chest: Effort normal.  Genitourinary: Penis normal. Circumcised. No penile erythema. No discharge found.  Musculoskeletal: Normal range of motion.  Neurological: He is alert.  Skin: Skin is warm and dry.  Psychiatric: He has a normal mood and affect.   Chaperone was present during exam.  ED Course  Procedures (including critical care time) Labs Review Labs Reviewed  GC/CHLAMYDIA PROBE AMP  RPR  HIV ANTIBODY (ROUTINE TESTING)    Imaging Review No results found.   EKG Interpretation None      MDM   Final diagnoses:  Exposure to trichomonas    Gc/clamydia/rpr/HIV collected.  Pt understands these tests are pending and will be notified for any positive results. He was prescribed flagyl for tx of trichomonas exposure.  PRN f/u anticipated.     Burgess Amor, PA-C 02/26/14 1655

## 2014-02-27 NOTE — ED Provider Notes (Signed)
Medical screening examination/treatment/procedure(s) were performed by non-physician practitioner and as supervising physician I was immediately available for consultation/collaboration.   EKG Interpretation None       Donnetta HutchingBrian Najat Olazabal, MD 02/27/14 [phone]

## 2014-02-28 LAB — GC/CHLAMYDIA PROBE AMP
CT Probe RNA: NEGATIVE
GC PROBE AMP APTIMA: NEGATIVE

## 2014-03-23 ENCOUNTER — Encounter (HOSPITAL_COMMUNITY): Payer: Self-pay | Admitting: Emergency Medicine

## 2014-03-23 ENCOUNTER — Emergency Department (HOSPITAL_COMMUNITY)
Admission: EM | Admit: 2014-03-23 | Discharge: 2014-03-23 | Disposition: A | Discharge status: Home / Self Care | Payer: BC Managed Care – PPO | Attending: Emergency Medicine | Admitting: Emergency Medicine

## 2014-03-23 DIAGNOSIS — Z8719 Personal history of other diseases of the digestive system: Secondary | ICD-10-CM | POA: Insufficient documentation

## 2014-03-23 DIAGNOSIS — Z87891 Personal history of nicotine dependence: Secondary | ICD-10-CM | POA: Insufficient documentation

## 2014-03-23 DIAGNOSIS — Y9389 Activity, other specified: Secondary | ICD-10-CM | POA: Insufficient documentation

## 2014-03-23 DIAGNOSIS — S01512A Laceration without foreign body of oral cavity, initial encounter: Secondary | ICD-10-CM

## 2014-03-23 DIAGNOSIS — Y929 Unspecified place or not applicable: Secondary | ICD-10-CM | POA: Insufficient documentation

## 2014-03-23 DIAGNOSIS — S01502A Unspecified open wound of oral cavity, initial encounter: Secondary | ICD-10-CM | POA: Insufficient documentation

## 2014-03-23 DIAGNOSIS — IMO0002 Reserved for concepts with insufficient information to code with codable children: Secondary | ICD-10-CM | POA: Insufficient documentation

## 2014-03-23 DIAGNOSIS — Z792 Long term (current) use of antibiotics: Secondary | ICD-10-CM | POA: Insufficient documentation

## 2014-03-23 DIAGNOSIS — I10 Essential (primary) hypertension: Secondary | ICD-10-CM | POA: Insufficient documentation

## 2014-03-23 DIAGNOSIS — Z23 Encounter for immunization: Secondary | ICD-10-CM | POA: Insufficient documentation

## 2014-03-23 DIAGNOSIS — Z79899 Other long term (current) drug therapy: Secondary | ICD-10-CM | POA: Insufficient documentation

## 2014-03-23 MED ORDER — TETANUS-DIPHTH-ACELL PERTUSSIS 5-2.5-18.5 LF-MCG/0.5 IM SUSP
0.5000 mL | Freq: Once | INTRAMUSCULAR | Status: AC
  Administered 2014-03-23: 0.5 mL via INTRAMUSCULAR
  Filled 2014-03-23: qty 0.5
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
## 2014-03-23 NOTE — ED Provider Notes (Signed)
Medical screening examination/treatment/procedure(s) were performed by non-physician practitioner and as supervising physician I was immediately available for consultation/collaboration.   EKG Interpretation None        Benny LennertJoseph L Kaedyn Polivka, MD 03/23/14 [phone]

## 2014-06-29 ENCOUNTER — Emergency Department (HOSPITAL_COMMUNITY)
Admission: EM | Admit: 2014-06-29 | Discharge: 2014-06-29 | Discharge status: Against Medical Advice | Payer: BC Managed Care – PPO | Attending: Emergency Medicine | Admitting: Emergency Medicine

## 2014-06-29 ENCOUNTER — Encounter (HOSPITAL_COMMUNITY): Payer: Self-pay | Admitting: *Deleted

## 2014-06-29 DIAGNOSIS — I10 Essential (primary) hypertension: Secondary | ICD-10-CM | POA: Diagnosis not present

## 2014-06-29 DIAGNOSIS — R05 Cough: Secondary | ICD-10-CM | POA: Diagnosis not present

## 2014-06-29 DIAGNOSIS — R110 Nausea: Secondary | ICD-10-CM | POA: Diagnosis not present

## 2014-06-29 DIAGNOSIS — R109 Unspecified abdominal pain: Secondary | ICD-10-CM | POA: Insufficient documentation

## 2014-06-29 NOTE — ED Notes (Signed)
Pt does not think he should have to wait because [facility]. Pt left to go to [facility].

## 2014-06-29 NOTE — ED Notes (Signed)
Pt states he coughed and had a sudden onset of abdominal pain and nausea. States he had to leave work and come here due the pain. Pt denies any n/v now.

## 2016-09-30 ENCOUNTER — Other Ambulatory Visit (HOSPITAL_COMMUNITY): Payer: Self-pay | Admitting: Family Medicine

## 2016-09-30 ENCOUNTER — Ambulatory Visit (HOSPITAL_COMMUNITY)
Admission: RE | Admit: 2016-09-30 | Discharge: 2016-09-30 | Disposition: A | Discharge status: Home / Self Care | Payer: BLUE CROSS/BLUE SHIELD | Source: Ambulatory Visit | Attending: Family Medicine | Admitting: Family Medicine

## 2016-09-30 DIAGNOSIS — S6991XD Unspecified injury of right wrist, hand and finger(s), subsequent encounter: Secondary | ICD-10-CM | POA: Diagnosis present

## 2016-09-30 DIAGNOSIS — X58XXXD Exposure to other specified factors, subsequent encounter: Secondary | ICD-10-CM | POA: Diagnosis not present

## 2017-09-23 ENCOUNTER — Emergency Department (HOSPITAL_COMMUNITY)
Admission: EM | Admit: 2017-09-23 | Discharge: 2017-09-23 | Disposition: A | Discharge status: Home / Self Care | Payer: BLUE CROSS/BLUE SHIELD | Attending: Emergency Medicine | Admitting: Emergency Medicine

## 2017-09-23 ENCOUNTER — Encounter (HOSPITAL_COMMUNITY): Payer: Self-pay

## 2017-09-23 DIAGNOSIS — Y929 Unspecified place or not applicable: Secondary | ICD-10-CM | POA: Insufficient documentation

## 2017-09-23 DIAGNOSIS — I10 Essential (primary) hypertension: Secondary | ICD-10-CM | POA: Diagnosis not present

## 2017-09-23 DIAGNOSIS — Y33XXXA Other specified events, undetermined intent, initial encounter: Secondary | ICD-10-CM | POA: Insufficient documentation

## 2017-09-23 DIAGNOSIS — Z79899 Other long term (current) drug therapy: Secondary | ICD-10-CM | POA: Diagnosis not present

## 2017-09-23 DIAGNOSIS — Y998 Other external cause status: Secondary | ICD-10-CM | POA: Diagnosis not present

## 2017-09-23 DIAGNOSIS — Z87891 Personal history of nicotine dependence: Secondary | ICD-10-CM | POA: Diagnosis not present

## 2017-09-23 DIAGNOSIS — Y939 Activity, unspecified: Secondary | ICD-10-CM | POA: Insufficient documentation

## 2017-09-23 DIAGNOSIS — S299XXA Unspecified injury of thorax, initial encounter: Secondary | ICD-10-CM | POA: Diagnosis present

## 2017-09-23 DIAGNOSIS — S29012A Strain of muscle and tendon of back wall of thorax, initial encounter: Secondary | ICD-10-CM | POA: Insufficient documentation

## 2017-09-23 DIAGNOSIS — S46811A Strain of other muscles, fascia and tendons at shoulder and upper arm level, right arm, initial encounter: Secondary | ICD-10-CM

## 2017-09-23 MED ORDER — CYCLOBENZAPRINE HCL 10 MG PO TABS: 10.0000 mg | ORAL_TABLET | Freq: Three times a day (TID) | ORAL | 0 refills | Status: AC

## 2017-09-23 MED ORDER — DEXAMETHASONE SODIUM PHOSPHATE 10 MG/ML IJ SOLN
10.0000 mg | Freq: Once | INTRAMUSCULAR | Status: AC
  Administered 2017-09-23: 10 mg via INTRAMUSCULAR
  Filled 2017-09-23: qty 1

## 2017-09-23 MED ORDER — ONDANSETRON HCL 4 MG PO TABS
4.0000 mg | ORAL_TABLET | Freq: Once | ORAL | Status: AC
  Administered 2017-09-23: 4 mg via ORAL
  Filled 2017-09-23: qty 1

## 2017-09-23 MED ORDER — IBUPROFEN 600 MG PO TABS: 600.0000 mg | ORAL_TABLET | Freq: Four times a day (QID) | ORAL | 0 refills | Status: AC

## 2017-09-23 MED ORDER — KETOROLAC TROMETHAMINE 10 MG PO TABS
10.0000 mg | ORAL_TABLET | Freq: Once | ORAL | Status: AC
Start: 2017-09-23 — End: 2017-09-23
  Administered 2017-09-23: 10 mg via ORAL
  Filled 2017-09-23: qty 1

## 2017-09-23 NOTE — ED Triage Notes (Signed)
Pt c/o pain in r shoulder blade x 1-2 weeks.  Pain worse with movement.

## 2017-09-23 NOTE — Discharge Instructions (Signed)
Heating pad to the area of your back and under your shoulder may be helpful.  Please use Flexeril 3 times daily.  This medication may cause drowsiness.  Please do not drive a vehicle, operate machinery, drink alcohol, or participate in activities requiring concentration when taking this medication.  Please use ibuprofen with breakfast, lunch, dinner, and at bedtime.  Please see Dr. Romeo AppleHarrison for additional orthopedic evaluation if you continue to have problems with your back and shoulder area.

## 2017-09-23 NOTE — ED Provider Notes (Signed)
[facility] EMERGENCY DEPARTMENT Provider Note   CSN: [id number] Arrival date & time: 09/23/17  0745     History   Chief Complaint Chief Complaint  Patient presents with  . Back Pain    HPI James Gordon is a 42 y.o. male.  Patient is a 42 year old male who presents to the emergency department with a complaint of pain of his upper back.  The patient states that over the past 1 or 2 weeks he has been having pain from his right shoulder blade extending up toward his neck and sometimes over to the right shoulder.  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  He states that he does not recall doing any strenuous work, but it is repetitive.  The patient is not dropping any objects or having any problem using his upper or lower extremities.  He has some tingling in the right shoulder and upper arm area at times.  No recent injury.  No recent operations or procedures to be reported.      Past Medical History:  Diagnosis Date  . Hypertension   . Pancreatitis     There are no active problems to display for this patient.   Past Surgical History:  Procedure Laterality Date  . GANGLION CYST EXCISION    . ROTATOR CUFF REPAIR         Home Medications    Prior to Admission medications   Medication Sig Start Date End Date Taking? Authorizing Provider  ALPRAZolam Prudy Feeler) 0.5 MG tablet Take 1 tablet (0.5 mg total) by mouth at bedtime as needed for anxiety. 01/18/14   Geoffery Lyons, MD  lisinopril (PRINIVIL,ZESTRIL) 10 MG tablet Take 10 mg by mouth daily.    [provider]  metroNIDAZOLE (FLAGYL) 500 MG tablet Take 1 tablet (500 mg total) by mouth 2 (two) times daily. 02/26/14   Burgess Amor, PA-C    Family History No family history on file.  Social History Social History   Tobacco Use  . Smoking status: Former Smoker    Packs/day: 0.50  . Smokeless tobacco: Never Used  Substance Use Topics  . Alcohol use: No    Comment: No  etoh since March  . Drug use: No     Allergies   Patient has no known allergies.   Review of Systems Review of Systems  Constitutional: Negative for activity change.       All ROS Neg except as noted in HPI  HENT: Negative for nosebleeds.   Eyes: Negative for photophobia and discharge.  Respiratory: Negative for cough, shortness of breath and wheezing.   Cardiovascular: Negative for chest pain and palpitations.  Gastrointestinal: Negative for abdominal pain and blood in stool.  Genitourinary: Negative for dysuria, frequency and hematuria.  Musculoskeletal: Positive for back pain. Negative for arthralgias and neck pain.  Skin: Negative.   Neurological: Negative for dizziness, seizures and speech difficulty.  Psychiatric/Behavioral: Negative for confusion and hallucinations.     Physical Exam Updated Vital Signs BP (!) 129/92 (BP Location: Left Arm)   Pulse 74   Temp 98.1 F (36.7 C) (Oral)   Resp 16   Ht 6' (1.829 m)   Wt 83.9 kg (185 lb)   SpO2 100%   BMI 25.09 kg/m   Physical Exam  Constitutional: He appears well-developed and well-nourished. No distress.  HENT:  Head: Normocephalic and atraumatic.  Right Ear: External ear normal.  Left Ear: External ear normal.  Eyes: Conjunctivae are normal.  Right eye exhibits no discharge. Left eye exhibits no discharge. No scleral icterus.  Neck: Neck supple. No tracheal deviation present.  Cardiovascular: Normal rate, regular rhythm and intact distal pulses.  Pulmonary/Chest: Effort normal and breath sounds normal. No stridor. No respiratory distress. He has no wheezes. He has no rales.  Abdominal: Soft. Bowel sounds are normal. He exhibits no distension. There is no tenderness. There is no rebound and no guarding.  Musculoskeletal: He exhibits no edema or tenderness.       Arms: Neurological: He is alert. He has normal strength. No cranial nerve deficit (no facial droop, extraocular movements intact, no slurred speech) or  sensory deficit. He exhibits normal muscle tone. He displays no seizure activity. Coordination normal.  Skin: Skin is warm and dry. No rash noted.  Psychiatric: He has a normal mood and affect.  Nursing note and vitals reviewed.    ED Treatments / Results  Labs (all labs ordered are listed, but only abnormal results are displayed) Labs Reviewed - No data to display  EKG  EKG Interpretation None       Radiology No results found.  Procedures Procedures (including critical care time)  Medications Ordered in ED Medications - No data to display   Initial Impression / Assessment and Plan / ED Course  I have reviewed the triage vital signs and the nursing notes.  Pertinent labs & imaging results that were available during my care of the patient were reviewed by me and considered in my medical decision making (see chart for details).       Final Clinical Impressions(s) / ED Diagnoses MDM  Vital signs reviewed.  Patient does not have any gross neurologic deficits appreciated at this time.  I suspect that the patient has muscle spasm involving the upper back and shoulder.  The patient has had some arthritis changes on his lumbar spine films and there may be some arthritis related changes in his upper back as well.  I discussed this with the patient.  No gross neurologic deficits appreciated on examination at this time.  No vascular deficits appreciated.  The patient will use a muscle relaxer and an anti-inflammatory pain medication at this time.  He will see Dr. Romeo AppleHarrison for orthopedic management if these are not effective.  Patient is in agreement with this plan.   Final diagnoses:  Strain of right trapezius muscle, initial encounter    ED Discharge Orders        Ordered    cyclobenzaprine (FLEXERIL) 10 MG tablet  3 times daily     09/23/17 0938    ibuprofen (ADVIL,MOTRIN) 600 MG tablet  4 times daily     09/23/17 0938       Ivery QualeBryant, Leanndra Pember, PA-C 09/23/17 [id number]      Raeford RazorKohut, Stephen, MD 09/23/17 1118

## 2017-09-23 NOTE — ED Notes (Signed)
Patient given discharge instruction, verbalized understand. Patient ambulatory out of the department.  

## 2017-10-09 ENCOUNTER — Ambulatory Visit (INDEPENDENT_AMBULATORY_CARE_PROVIDER_SITE_OTHER): Payer: BLUE CROSS/BLUE SHIELD | Admitting: Internal Medicine

## 2018-04-09 IMAGING — DX DG WRIST COMPLETE 3+V*R*
4 series · 4 of 4 positions shown · non-contrast
Comparison: Radiographs July 03, 2013.

CLINICAL DATA: Right wrist pain after fall on ice several weeks
ago.

EXAM:
RIGHT WRIST - COMPLETE 3+ VIEW

[wrist pa]
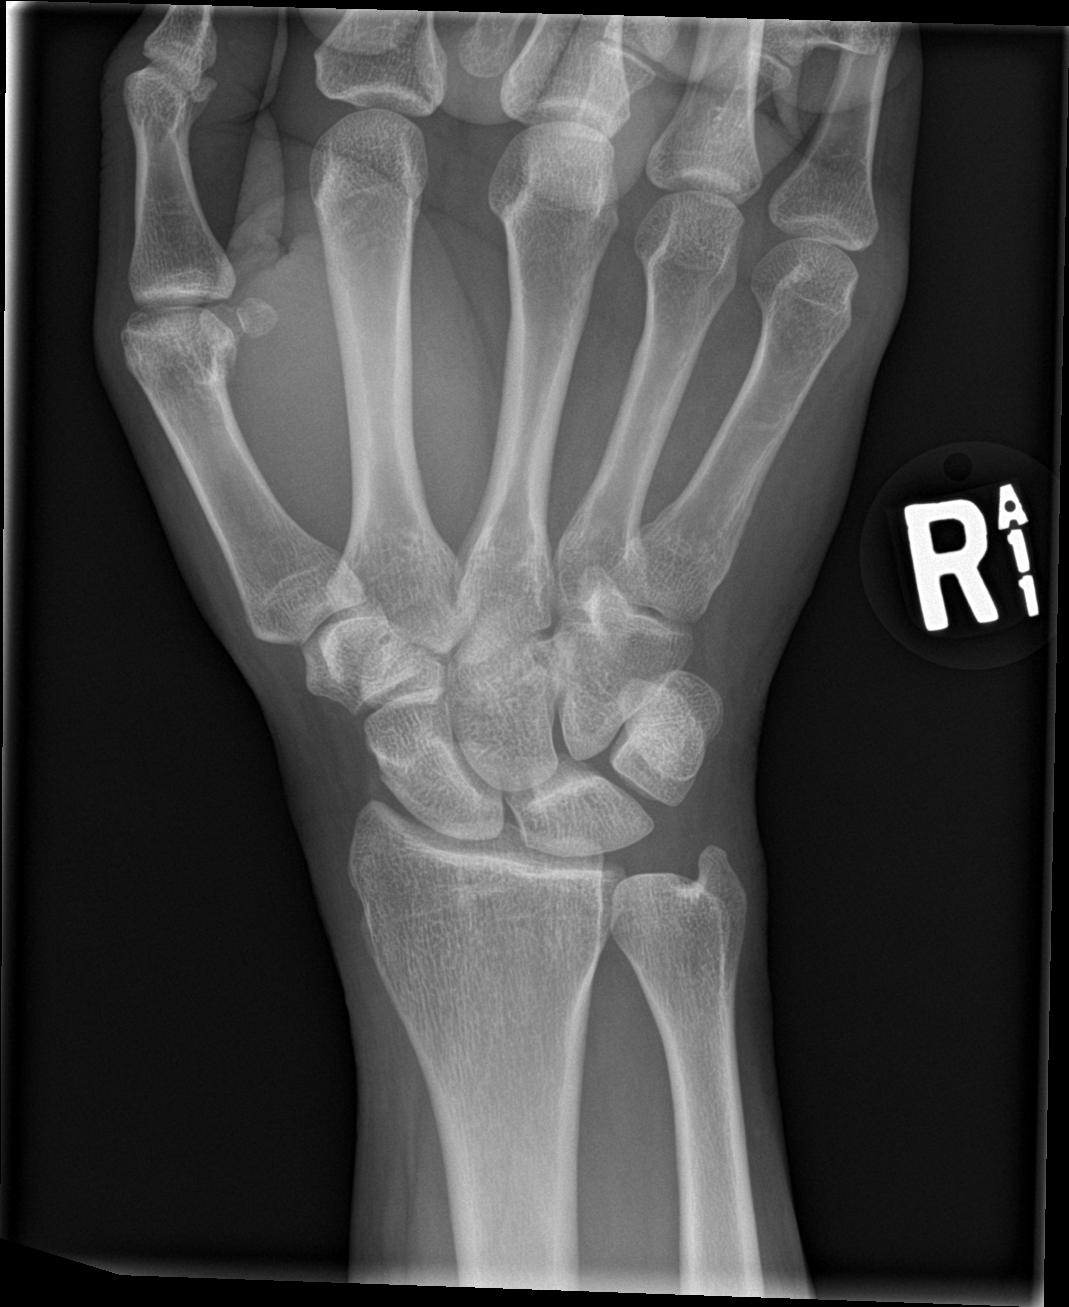

[wrist obl]
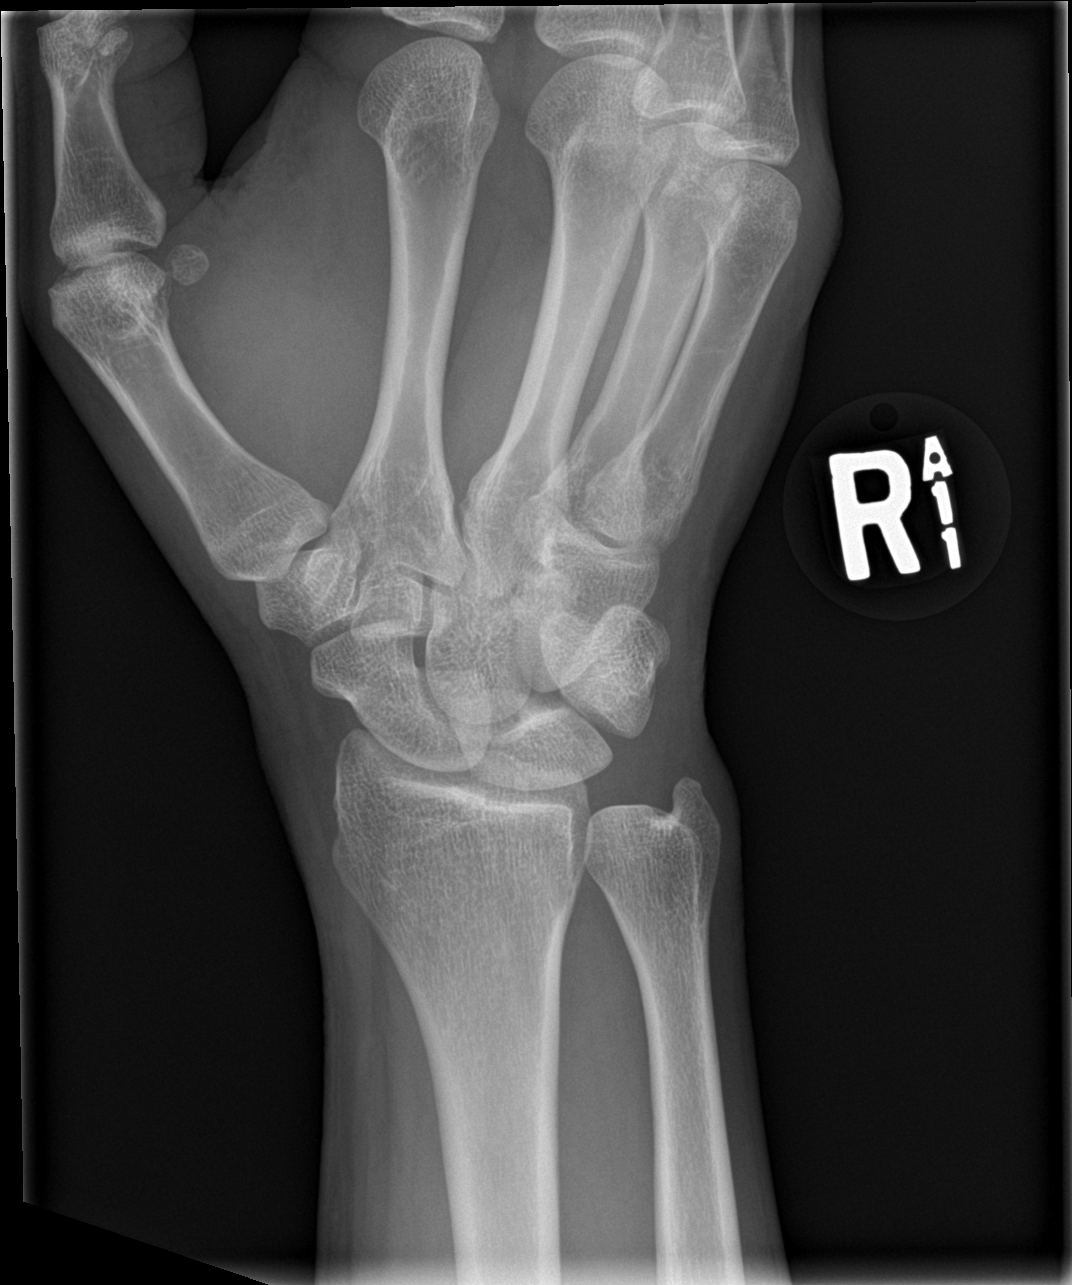

[wrist lat]
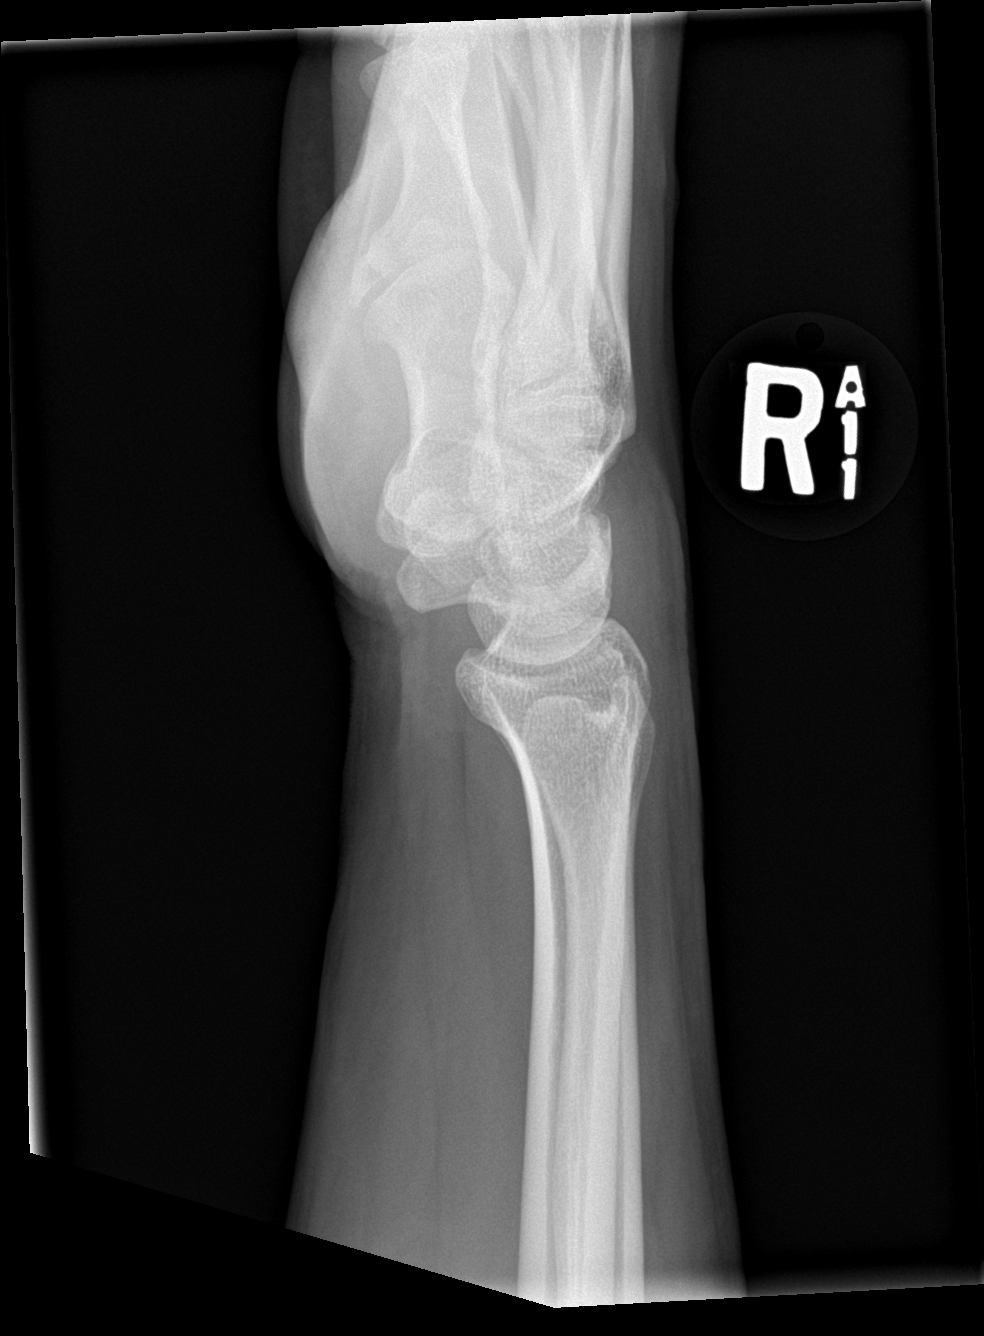

[wrist navicular]
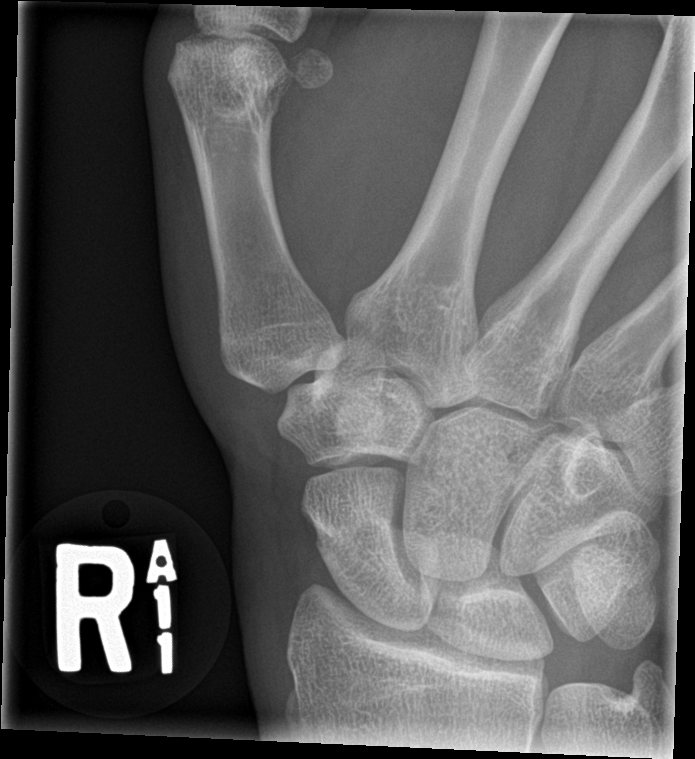

[4 of 4 positions shown; findings below may reference images not displayed]

FINDINGS: There is no evidence of fracture or dislocation. There is no
evidence of arthropathy or other focal bone abnormality. Soft
tissues are unremarkable.
IMPRESSION: Normal right wrist.

## 2018-09-21 ENCOUNTER — Encounter (HOSPITAL_COMMUNITY): Payer: Self-pay | Admitting: Emergency Medicine

## 2018-09-21 ENCOUNTER — Other Ambulatory Visit: Payer: Self-pay

## 2018-09-21 ENCOUNTER — Emergency Department (HOSPITAL_COMMUNITY)
Admission: EM | Admit: 2018-09-21 | Discharge: 2018-09-21 | Disposition: A | Discharge status: Home / Self Care | Payer: BLUE CROSS/BLUE SHIELD | Attending: Emergency Medicine | Admitting: Emergency Medicine

## 2018-09-21 DIAGNOSIS — Z79899 Other long term (current) drug therapy: Secondary | ICD-10-CM | POA: Insufficient documentation

## 2018-09-21 DIAGNOSIS — R059 Cough, unspecified: Secondary | ICD-10-CM

## 2018-09-21 DIAGNOSIS — M791 Myalgia, unspecified site: Secondary | ICD-10-CM | POA: Insufficient documentation

## 2018-09-21 DIAGNOSIS — J029 Acute pharyngitis, unspecified: Secondary | ICD-10-CM | POA: Insufficient documentation

## 2018-09-21 DIAGNOSIS — R05 Cough: Secondary | ICD-10-CM

## 2018-09-21 DIAGNOSIS — I10 Essential (primary) hypertension: Secondary | ICD-10-CM | POA: Insufficient documentation

## 2018-09-21 DIAGNOSIS — Z87891 Personal history of nicotine dependence: Secondary | ICD-10-CM | POA: Insufficient documentation

## 2018-09-21 NOTE — ED Triage Notes (Signed)
Cough started Saturday, cough is productive

## 2018-09-21 NOTE — ED Provider Notes (Signed)
[facility] EMERGENCY DEPARTMENT Provider Note   CSN: [id number] Arrival date & time: 09/21/18  0809     History   Chief Complaint Chief Complaint  Patient presents with  . Cough    HPI James Gordon is a 43 y.o. adult.  43 y.o male with a PHM of HTN presents to the ED with a chief complaint of cough, sire throat and "body weak" x yesterday. Patient reports a dry cough along with a ache feeling in his throat especially with swallowing. He reports taking mucinex, nyquil, logenze but states no relieve in symptoms. Patient also endorses myalgia and nausea. He denies any chest pain, shortness of breath, abdominal pain or fevers. He reports not getting a flu shot this season.   The history is provided by the patient.  Cough  Cough characteristics:  Dry Sputum characteristics:  Nondescript Severity:  Mild Onset quality:  Sudden Duration:  1 day Associated symptoms: sore throat   Associated symptoms: no fever and no shortness of breath     Past Medical History:  Diagnosis Date  . Hypertension   . Pancreatitis     There are no active problems to display for this patient.   Past Surgical History:  Procedure Laterality Date  . GANGLION CYST EXCISION    . ROTATOR CUFF REPAIR          Home Medications    Prior to Admission medications   Medication Sig Start Date End Date Taking? Authorizing Provider  ALPRAZolam Prudy Feeler(XANAX) 0.5 MG tablet Take 1 tablet (0.5 mg total) by mouth at bedtime as needed for anxiety. 01/18/14   Geoffery Lyonselo, Douglas, MD  cyclobenzaprine (FLEXERIL) 10 MG tablet Take 1 tablet (10 mg total) by mouth 3 (three) times daily. 09/23/17   Ivery QualeBryant, Hobson, PA-C  ibuprofen (ADVIL,MOTRIN) 600 MG tablet Take 1 tablet (600 mg total) by mouth 4 (four) times daily. 09/23/17   Ivery QualeBryant, Hobson, PA-C  lisinopril (PRINIVIL,ZESTRIL) 10 MG tablet Take 10 mg by mouth daily.    [provider]  metroNIDAZOLE (FLAGYL) 500 MG tablet Take 1 tablet (500 mg total) by mouth 2 (two) times  daily. 02/26/14   Burgess AmorIdol, Julie, PA-C    Family History History reviewed. No pertinent family history.  Social History Social History   Tobacco Use  . Smoking status: Former Smoker    Packs/day: 0.50  . Smokeless tobacco: Never Used  Substance Use Topics  . Alcohol use: No    Comment: No etoh since March  . Drug use: No     Allergies   Patient has no known allergies.   Review of Systems Review of Systems  Constitutional: Negative for fever.  HENT: Positive for sore throat.   Respiratory: Positive for cough. Negative for shortness of breath.      Physical Exam Updated Vital Signs BP (!) 152/101 (BP Location: Left Arm)   Pulse (!) 105   Temp 99.7 F (37.6 C) (Oral)   Resp 18   Ht 6' (1.829 m)   Wt 90.7 kg   SpO2 99%   BMI 27.12 kg/m   Physical Exam Vitals signs and nursing note reviewed.  Constitutional:      General: She is not in acute distress.    Appearance: Normal appearance. She is well-developed. She is not ill-appearing.  HENT:     Head: Normocephalic and atraumatic.     Mouth/Throat:     Pharynx: No oropharyngeal exudate.  Eyes:     Pupils: Pupils are equal, round, and reactive to  light.  Neck:     Musculoskeletal: Normal range of motion.  Cardiovascular:     Rate and Rhythm: Regular rhythm.     Heart sounds: Normal heart sounds.  Pulmonary:     Effort: Pulmonary effort is normal. No respiratory distress.     Breath sounds: Normal breath sounds. No wheezing or rhonchi.  Chest:     Chest wall: No tenderness.  Abdominal:     General: Bowel sounds are normal. There is no distension.     Palpations: Abdomen is soft.     Tenderness: There is no abdominal tenderness.  Musculoskeletal:        General: No tenderness or deformity.     Right lower leg: No edema.     Left lower leg: No edema.  Skin:    General: Skin is warm and dry.  Neurological:     Mental Status: She is alert and oriented to person, place, and time.      ED Treatments /  Results  Labs (all labs ordered are listed, but only abnormal results are displayed) Labs Reviewed - No data to display  EKG None  Radiology No results found.  Procedures Procedures (including critical care time)  Medications Ordered in ED Medications - No data to display   Initial Impression / Assessment and Plan / ED Course  I have reviewed the triage vital signs and the nursing notes.  Pertinent labs & imaging results that were available during my care of the patient were reviewed by me and considered in my medical decision making (see chart for details).    Patient presents with cough along with x1 day.  Patient reports obtaining Mucinex along with NyQuil yesterday but no relieving symptoms after 1 day of therapy.  Vital signs are stable low-grade fever in the ED 99.7, patient is well-appearing otherwise lungs are clear to auscultation.  Low suspicion for pneumonia.  Patient does report he did not obtain a flu shot this season.  Patient is currently having pain with over-the-counter medication.  At this time patient has been advised to continue with treatment as they would take a couple days for it to improve.  He is requesting a doctor's note due to the fact he did not attend work this morning.  I will provide patient with note at this time, no further testing was indicated as patient is very well-appearing with a dry cough and mild rhinorrhea.  Denies any red flags or chest pain or shortness of breath.  Return precautions provided, vitals stable for discharge.  Final Clinical Impressions(s) / ED Diagnoses   Final diagnoses:  Cough    ED Discharge Orders    None       Claude Manges, PA-C 09/21/18 1031    Raeford Razor, MD 09/21/18 1432

## 2018-09-21 NOTE — Discharge Instructions (Addendum)
Please purchase an expectorant like Mucinex to help loosen and thin the mucus production.You may also try some over the counter Robitussin to help with your cough. Continue to hydrate with plenty of fluids and Gatorade.If you experience any shortness of breath, chest pain or fever please return to the ED for reevaluation.  ° °

## 2020-04-20 ENCOUNTER — Other Ambulatory Visit: Payer: Self-pay

## 2020-04-22 ENCOUNTER — Ambulatory Visit
Admission: EM | Admit: 2020-04-22 | Discharge: 2020-04-22 | Disposition: A | Discharge status: Home / Self Care | Payer: BC Managed Care – PPO | Attending: Emergency Medicine | Admitting: Emergency Medicine

## 2020-04-22 ENCOUNTER — Other Ambulatory Visit: Payer: Self-pay

## 2020-04-22 DIAGNOSIS — Z20822 Contact with and (suspected) exposure to covid-19: Secondary | ICD-10-CM

## 2020-04-22 NOTE — ED Triage Notes (Signed)
PT NEEDS NEGATIVE TEST FOR WORK, NO SYMPTOMS

## 2020-04-23 LAB — NOVEL CORONAVIRUS, NAA: SARS-CoV-2, NAA: DETECTED — AB

## 2020-04-28 ENCOUNTER — Other Ambulatory Visit: Payer: Self-pay

## 2020-04-28 ENCOUNTER — Ambulatory Visit
Admission: EM | Admit: 2020-04-28 | Discharge: 2020-04-28 | Disposition: A | Discharge status: Home / Self Care | Payer: BC Managed Care – PPO | Attending: Emergency Medicine | Admitting: Emergency Medicine

## 2020-04-28 DIAGNOSIS — Z1152 Encounter for screening for COVID-19: Secondary | ICD-10-CM | POA: Diagnosis not present

## 2020-04-28 NOTE — ED Triage Notes (Signed)
covid test no s/s  °

## 2020-05-01 LAB — NOVEL CORONAVIRUS, NAA: SARS-CoV-2, NAA: NOT DETECTED

## 2020-06-28 ENCOUNTER — Other Ambulatory Visit: Payer: Self-pay

## 2020-06-28 ENCOUNTER — Ambulatory Visit
Admission: EM | Admit: 2020-06-28 | Discharge: 2020-06-28 | Disposition: A | Discharge status: Home / Self Care | Payer: BC Managed Care – PPO | Attending: Emergency Medicine | Admitting: Emergency Medicine

## 2020-06-28 DIAGNOSIS — R519 Headache, unspecified: Secondary | ICD-10-CM

## 2020-06-28 DIAGNOSIS — Z1152 Encounter for screening for COVID-19: Secondary | ICD-10-CM

## 2020-06-28 DIAGNOSIS — R52 Pain, unspecified: Secondary | ICD-10-CM

## 2020-06-28 NOTE — ED Provider Notes (Signed)
[facility]   [id number] 06/28/20 Arrival Time: 1706   CC: COVID symptoms  SUBJECTIVE: History from: patient.  James Gordon is a 44 y.o. adult who presented to the urgent care with a complaint of fever,  body aches and headache for the past few days.  Denies sick exposure to COVID, flu or strep.  Denies recent travel.  Has tried  OTC medication without relief.  Denies aggravating factors.  Report history of previous symptoms in the past.   Denies fever, chills, fatigue, sinus pain, rhinorrhea, sore throat, SOB, wheezing, chest pain, nausea, changes in bowel or bladder habits.    ROS: As per HPI.  All other pertinent ROS negative.      Past Medical History:  Diagnosis Date  . Hypertension   . Pancreatitis    Past Surgical History:  Procedure Laterality Date  . GANGLION CYST EXCISION    . ROTATOR CUFF REPAIR     No Known Allergies No current facility-administered medications on file prior to encounter.   Current Outpatient Medications on File Prior to Encounter  Medication Sig Dispense Refill  . ALPRAZolam (XANAX) 0.5 MG tablet Take 1 tablet (0.5 mg total) by mouth at bedtime as needed for anxiety. 10 tablet 0  . cyclobenzaprine (FLEXERIL) 10 MG tablet Take 1 tablet (10 mg total) by mouth 3 (three) times daily. 20 tablet 0  . ibuprofen (ADVIL,MOTRIN) 600 MG tablet Take 1 tablet (600 mg total) by mouth 4 (four) times daily. 30 tablet 0  . lisinopril (PRINIVIL,ZESTRIL) 10 MG tablet Take 10 mg by mouth daily.    . metroNIDAZOLE (FLAGYL) 500 MG tablet Take 1 tablet (500 mg total) by mouth 2 (two) times daily. 14 tablet 0   Social History   Socioeconomic History  . Marital status: Single    Spouse name: Not on file  . Number of children: Not on file  . Years of education: Not on file  . Highest education level: Not on file  Occupational History  . Not on file  Tobacco Use  . Smoking status: Former Smoker    Packs/day: 0.50  . Smokeless tobacco: Never Used    Substance and Sexual Activity  . Alcohol use: No    Comment: No etoh since March  . Drug use: No  . Sexual activity: Not on file  Other Topics Concern  . Not on file  Social History Narrative  . Not on file   Social Determinants of Health   Financial Resource Strain:   . Difficulty of Paying Living Expenses: Not on file  Food Insecurity:   . Worried About Programme researcher, broadcasting/film/video in the Last Year: Not on file  . Ran Out of Food in the Last Year: Not on file  Transportation Needs:   . Lack of Transportation (Medical): Not on file  . Lack of Transportation (Non-Medical): Not on file  Physical Activity:   . Days of Exercise per Week: Not on file  . Minutes of Exercise per Session: Not on file  Stress:   . Feeling of Stress : Not on file  Social Connections:   . Frequency of Communication with Friends and Family: Not on file  . Frequency of Social Gatherings with Friends and Family: Not on file  . Attends Religious Services: Not on file  . Active Member of Clubs or Organizations: Not on file  . Attends Banker Meetings: Not on file  . Marital Status: Not on file  Intimate Partner Violence:   .  Fear of Current or Ex-Partner: Not on file  . Emotionally Abused: Not on file  . Physically Abused: Not on file  . Sexually Abused: Not on file   Family History  Family history unknown: Yes    OBJECTIVE:  Vitals:   06/28/20 1726  BP: 138/87  Pulse: (!) 110  Resp: 18  Temp: 99.2 F (37.3 C)  SpO2: 96%     General appearance: alert; appears fatigued, but nontoxic; speaking in full sentences and tolerating own secretions HEENT: NCAT; Ears: EACs clear, TMs pearly gray; Eyes: PERRL.  EOM grossly intact. Sinuses: nontender; Nose: nares patent without rhinorrhea, Throat: oropharynx clear, tonsils non erythematous or enlarged, uvula midline  Neck: supple without LAD Lungs: unlabored respirations, symmetrical air entry; cough: absent, mild; no respiratory distress;  CTAB Heart: regular rate and rhythm.  Radial pulses 2+ symmetrical bilaterally Skin: warm and dry Psychological: alert and cooperative; normal mood and affect  LABS:  No results found for this or any previous visit (from the past 24 hour(s)).   ASSESSMENT & PLAN:  1. Encounter for screening for COVID-19   2. Body aches   3. Acute nonintractable headache, unspecified headache type     No orders of the defined types were placed in this encounter.   Discharge instructions  COVID testing ordered.  It will take between 2-7 days for test results.  Someone will contact you regarding abnormal results.    In the meantime: You should remain isolated in your home for 10 days from symptom onset AND greater than 24 hours after symptoms resolution (absence of fever without the use of fever-reducing medication and improvement in respiratory symptoms), whichever is longer Get plenty of rest and push fluids Use OTC medications like ibuprofen or tylenol as needed fever or pain Use medications daily for symptom relief Call or go to the ED if you have any new or worsening symptoms such as fever, worsening cough, shortness of breath, chest tightness, chest pain, turning blue, changes in mental status, etc...   Reviewed expectations re: course of current medical issues. Questions answered. Outlined signs and symptoms indicating need for more acute intervention. Patient verbalized understanding. After Visit Summary given.         Durward Parcel, FNP 06/28/20 1747

## 2020-06-28 NOTE — Discharge Instructions (Signed)
COVID testing ordered.  It will take between 2-7 days for test results.  Someone will contact you regarding abnormal results.    In the meantime: You should remain isolated in your home for 10 days from symptom onset AND greater than 24 hours after symptoms resolution (absence of fever without the use of fever-reducing medication and improvement in respiratory symptoms), whichever is longer Get plenty of rest and push fluids Use OTC medications like ibuprofen or tylenol as needed fever or pain Use medications daily for symptom relief Call or go to the ED if you have any new or worsening symptoms such as fever, worsening cough, shortness of breath, chest tightness, chest pain, turning blue, changes in mental status, etc..

## 2020-06-28 NOTE — ED Triage Notes (Signed)
Pt states he has had body aches and feeling unwell, also c/o diarrhea . Pt had covid in September  but states he feels similar

## 2020-06-29 LAB — COVID-19, FLU A+B AND RSV
Influenza A, NAA: NOT DETECTED
Influenza B, NAA: NOT DETECTED
RSV, NAA: NOT DETECTED
SARS-CoV-2, NAA: NOT DETECTED

## 2021-06-21 DIAGNOSIS — Z6827 Body mass index (BMI) 27.0-27.9, adult: Secondary | ICD-10-CM | POA: Diagnosis not present

## 2021-06-21 DIAGNOSIS — B009 Herpesviral infection, unspecified: Secondary | ICD-10-CM | POA: Diagnosis not present

## 2021-06-21 DIAGNOSIS — R030 Elevated blood-pressure reading, without diagnosis of hypertension: Secondary | ICD-10-CM | POA: Diagnosis not present

## 2021-06-21 DIAGNOSIS — Z1331 Encounter for screening for depression: Secondary | ICD-10-CM | POA: Diagnosis not present

## 2022-07-04 DIAGNOSIS — Z1331 Encounter for screening for depression: Secondary | ICD-10-CM | POA: Diagnosis not present

## 2022-07-04 DIAGNOSIS — Z0001 Encounter for general adult medical examination with abnormal findings: Secondary | ICD-10-CM | POA: Diagnosis not present

## 2022-07-04 DIAGNOSIS — R030 Elevated blood-pressure reading, without diagnosis of hypertension: Secondary | ICD-10-CM | POA: Diagnosis not present

## 2022-07-04 DIAGNOSIS — B009 Herpesviral infection, unspecified: Secondary | ICD-10-CM | POA: Diagnosis not present

## 2022-07-04 DIAGNOSIS — Z6824 Body mass index (BMI) 24.0-24.9, adult: Secondary | ICD-10-CM | POA: Diagnosis not present

## 2022-07-04 DIAGNOSIS — E119 Type 2 diabetes mellitus without complications: Secondary | ICD-10-CM | POA: Diagnosis not present

## 2022-09-20 DIAGNOSIS — B356 Tinea cruris: Secondary | ICD-10-CM | POA: Diagnosis not present

## 2022-09-20 DIAGNOSIS — Z6824 Body mass index (BMI) 24.0-24.9, adult: Secondary | ICD-10-CM | POA: Diagnosis not present

## 2022-09-20 DIAGNOSIS — Z711 Person with feared health complaint in whom no diagnosis is made: Secondary | ICD-10-CM | POA: Diagnosis not present

## 2022-12-02 DIAGNOSIS — H35713 Central serous chorioretinopathy, bilateral: Secondary | ICD-10-CM | POA: Diagnosis not present

## 2023-04-17 DIAGNOSIS — I10 Essential (primary) hypertension: Secondary | ICD-10-CM | POA: Diagnosis not present

## 2023-04-17 DIAGNOSIS — B009 Herpesviral infection, unspecified: Secondary | ICD-10-CM | POA: Diagnosis not present

## 2023-04-17 DIAGNOSIS — R7309 Other abnormal glucose: Secondary | ICD-10-CM | POA: Diagnosis not present

## 2023-04-17 DIAGNOSIS — Z711 Person with feared health complaint in whom no diagnosis is made: Secondary | ICD-10-CM | POA: Diagnosis not present

## 2023-04-17 DIAGNOSIS — Z6825 Body mass index (BMI) 25.0-25.9, adult: Secondary | ICD-10-CM | POA: Diagnosis not present

## 2023-04-17 DIAGNOSIS — Z0001 Encounter for general adult medical examination with abnormal findings: Secondary | ICD-10-CM | POA: Diagnosis not present

## 2023-04-17 DIAGNOSIS — Z1331 Encounter for screening for depression: Secondary | ICD-10-CM | POA: Diagnosis not present

## 2023-06-20 DIAGNOSIS — D125 Benign neoplasm of sigmoid colon: Secondary | ICD-10-CM | POA: Diagnosis not present

## 2023-06-20 DIAGNOSIS — Z1211 Encounter for screening for malignant neoplasm of colon: Secondary | ICD-10-CM | POA: Diagnosis not present

## 2023-06-20 DIAGNOSIS — K635 Polyp of colon: Secondary | ICD-10-CM | POA: Diagnosis not present

## 2023-06-20 DIAGNOSIS — K644 Residual hemorrhoidal skin tags: Secondary | ICD-10-CM | POA: Diagnosis not present

## 2023-08-23 DIAGNOSIS — R110 Nausea: Secondary | ICD-10-CM | POA: Diagnosis not present

## 2023-08-23 DIAGNOSIS — I10 Essential (primary) hypertension: Secondary | ICD-10-CM | POA: Diagnosis not present

## 2023-08-23 DIAGNOSIS — B349 Viral infection, unspecified: Secondary | ICD-10-CM | POA: Diagnosis not present

## 2024-04-02 DIAGNOSIS — R300 Dysuria: Secondary | ICD-10-CM | POA: Diagnosis not present

## 2024-04-02 DIAGNOSIS — Z711 Person with feared health complaint in whom no diagnosis is made: Secondary | ICD-10-CM | POA: Diagnosis not present

## 2024-04-02 DIAGNOSIS — E663 Overweight: Secondary | ICD-10-CM | POA: Diagnosis not present

## 2024-04-02 DIAGNOSIS — Z6825 Body mass index (BMI) 25.0-25.9, adult: Secondary | ICD-10-CM | POA: Diagnosis not present

## 2024-04-21 DIAGNOSIS — Z113 Encounter for screening for infections with a predominantly sexual mode of transmission: Secondary | ICD-10-CM | POA: Diagnosis not present
